# Patient Record
Sex: Male | Born: 1945 | Race: White | Hispanic: No | Marital: Married | Smoking: Never smoker
Health system: Southern US, Community
[De-identification: ages and names within clinical notes are randomized; demographics above are authoritative.]

## PROBLEM LIST (undated history)

## (undated) DIAGNOSIS — G4730 Sleep apnea, unspecified: Secondary | ICD-10-CM

## (undated) DIAGNOSIS — I10 Essential (primary) hypertension: Secondary | ICD-10-CM

## (undated) DIAGNOSIS — E785 Hyperlipidemia, unspecified: Secondary | ICD-10-CM

## (undated) DIAGNOSIS — D472 Monoclonal gammopathy: Secondary | ICD-10-CM

## (undated) DIAGNOSIS — Z8619 Personal history of other infectious and parasitic diseases: Secondary | ICD-10-CM

## (undated) DIAGNOSIS — R768 Other specified abnormal immunological findings in serum: Secondary | ICD-10-CM

## (undated) DIAGNOSIS — Z972 Presence of dental prosthetic device (complete) (partial): Secondary | ICD-10-CM

## (undated) HISTORY — PX: OTHER SURGICAL HISTORY: SHX169

## (undated) HISTORY — DX: Monoclonal gammopathy: D47.2

## (undated) HISTORY — DX: Other specified abnormal immunological findings in serum: R76.8

---

## 2011-02-25 ENCOUNTER — Emergency Department: Payer: Self-pay | Admitting: Emergency Medicine

## 2013-12-07 ENCOUNTER — Ambulatory Visit: Payer: Self-pay

## 2014-02-17 DIAGNOSIS — I10 Essential (primary) hypertension: Secondary | ICD-10-CM | POA: Insufficient documentation

## 2014-02-17 DIAGNOSIS — E785 Hyperlipidemia, unspecified: Secondary | ICD-10-CM | POA: Insufficient documentation

## 2014-02-17 DIAGNOSIS — E559 Vitamin D deficiency, unspecified: Secondary | ICD-10-CM | POA: Insufficient documentation

## 2015-03-19 DIAGNOSIS — B028 Zoster with other complications: Secondary | ICD-10-CM | POA: Diagnosis not present

## 2015-03-21 DIAGNOSIS — B0221 Postherpetic geniculate ganglionitis: Secondary | ICD-10-CM | POA: Diagnosis not present

## 2015-03-29 DIAGNOSIS — Z125 Encounter for screening for malignant neoplasm of prostate: Secondary | ICD-10-CM | POA: Diagnosis not present

## 2015-03-29 DIAGNOSIS — E785 Hyperlipidemia, unspecified: Secondary | ICD-10-CM | POA: Diagnosis not present

## 2015-03-29 DIAGNOSIS — E559 Vitamin D deficiency, unspecified: Secondary | ICD-10-CM | POA: Diagnosis not present

## 2015-03-29 DIAGNOSIS — I159 Secondary hypertension, unspecified: Secondary | ICD-10-CM | POA: Diagnosis not present

## 2015-03-30 DIAGNOSIS — B0221 Postherpetic geniculate ganglionitis: Secondary | ICD-10-CM | POA: Diagnosis not present

## 2015-04-05 DIAGNOSIS — N183 Chronic kidney disease, stage 3 unspecified: Secondary | ICD-10-CM | POA: Insufficient documentation

## 2015-04-05 DIAGNOSIS — I10 Essential (primary) hypertension: Secondary | ICD-10-CM | POA: Diagnosis not present

## 2015-04-05 DIAGNOSIS — Z0000 Encounter for general adult medical examination without abnormal findings: Secondary | ICD-10-CM | POA: Diagnosis not present

## 2015-04-05 DIAGNOSIS — D649 Anemia, unspecified: Secondary | ICD-10-CM | POA: Diagnosis not present

## 2015-04-11 DIAGNOSIS — B0221 Postherpetic geniculate ganglionitis: Secondary | ICD-10-CM | POA: Diagnosis not present

## 2015-05-23 DIAGNOSIS — D649 Anemia, unspecified: Secondary | ICD-10-CM | POA: Diagnosis not present

## 2015-05-23 DIAGNOSIS — Z1211 Encounter for screening for malignant neoplasm of colon: Secondary | ICD-10-CM | POA: Diagnosis not present

## 2015-05-31 DIAGNOSIS — H4011X3 Primary open-angle glaucoma, severe stage: Secondary | ICD-10-CM | POA: Diagnosis not present

## 2015-07-08 DIAGNOSIS — E780 Pure hypercholesterolemia: Secondary | ICD-10-CM | POA: Diagnosis not present

## 2015-07-08 DIAGNOSIS — N183 Chronic kidney disease, stage 3 (moderate): Secondary | ICD-10-CM | POA: Diagnosis not present

## 2015-07-08 DIAGNOSIS — I10 Essential (primary) hypertension: Secondary | ICD-10-CM | POA: Diagnosis not present

## 2015-07-08 DIAGNOSIS — D649 Anemia, unspecified: Secondary | ICD-10-CM | POA: Diagnosis not present

## 2015-07-21 ENCOUNTER — Encounter: Payer: Self-pay | Admitting: *Deleted

## 2015-07-22 ENCOUNTER — Encounter
Admission: RE | Disposition: A | Discharge status: Home / Self Care | Payer: Self-pay | Source: Ambulatory Visit | Attending: Gastroenterology

## 2015-07-22 ENCOUNTER — Ambulatory Visit
Admission: RE | Admit: 2015-07-22 | Discharge: 2015-07-22 | Disposition: A | Discharge status: Home / Self Care | Payer: Commercial Managed Care - HMO | Source: Ambulatory Visit | Attending: Gastroenterology | Admitting: Gastroenterology

## 2015-07-22 ENCOUNTER — Ambulatory Visit: Payer: Commercial Managed Care - HMO | Admitting: Anesthesiology

## 2015-07-22 ENCOUNTER — Encounter: Payer: Self-pay | Admitting: *Deleted

## 2015-07-22 DIAGNOSIS — I10 Essential (primary) hypertension: Secondary | ICD-10-CM | POA: Diagnosis not present

## 2015-07-22 DIAGNOSIS — E785 Hyperlipidemia, unspecified: Secondary | ICD-10-CM | POA: Diagnosis not present

## 2015-07-22 DIAGNOSIS — K5790 Diverticulosis of intestine, part unspecified, without perforation or abscess without bleeding: Secondary | ICD-10-CM | POA: Diagnosis not present

## 2015-07-22 DIAGNOSIS — K5730 Diverticulosis of large intestine without perforation or abscess without bleeding: Secondary | ICD-10-CM | POA: Diagnosis not present

## 2015-07-22 DIAGNOSIS — Z1211 Encounter for screening for malignant neoplasm of colon: Secondary | ICD-10-CM | POA: Insufficient documentation

## 2015-07-22 HISTORY — DX: Essential (primary) hypertension: I10

## 2015-07-22 HISTORY — DX: Hyperlipidemia, unspecified: E78.5

## 2015-07-22 HISTORY — DX: Personal history of other infectious and parasitic diseases: Z86.19

## 2015-07-22 HISTORY — PX: COLONOSCOPY WITH PROPOFOL: SHX5780

## 2015-07-22 SURGERY — COLONOSCOPY WITH PROPOFOL
Anesthesia: General

## 2015-07-22 MED ORDER — PROPOFOL 10 MG/ML IV BOLUS
INTRAVENOUS | Status: DC | PRN
  Administered 2015-07-22: 50 mg via INTRAVENOUS

## 2015-07-22 MED ORDER — SODIUM CHLORIDE 0.9 % IV SOLN
INTRAVENOUS | Status: DC
Start: 2015-07-22 — End: 2015-07-22

## 2015-07-22 MED ORDER — PROPOFOL 500 MG/50ML IV EMUL
INTRAVENOUS | Status: DC | PRN
  Administered 2015-07-22: 120 ug/kg/min via INTRAVENOUS

## 2015-07-22 MED ORDER — FENTANYL CITRATE (PF) 100 MCG/2ML IJ SOLN
INTRAMUSCULAR | Status: DC | PRN
  Administered 2015-07-22: 1 ug via INTRAVENOUS

## 2015-07-22 MED ORDER — MIDAZOLAM HCL 5 MG/5ML IJ SOLN
INTRAMUSCULAR | Status: DC | PRN
  Administered 2015-07-22: 1 mg via INTRAVENOUS

## 2015-07-22 MED ORDER — SODIUM CHLORIDE 0.9 % IV SOLN
INTRAVENOUS | Status: DC
  Administered 2015-07-22 (×2): via INTRAVENOUS

## 2015-07-22 MED ORDER — EPHEDRINE SULFATE 50 MG/ML IJ SOLN
INTRAMUSCULAR | Status: DC | PRN
  Administered 2015-07-22: 10 mg via INTRAVENOUS

## 2015-07-22 NOTE — Anesthesia Postprocedure Evaluation (Signed)
  Anesthesia Post-op Note  Patient: Bruce Garza  Procedure(s) Performed: Procedure(s): COLONOSCOPY WITH PROPOFOL (N/A)  Anesthesia type:General  Patient location: PACU  Post pain: Pain level controlled  Post assessment: Post-op Vital signs reviewed, Patient's Cardiovascular Status Stable, Respiratory Function Stable, Patent Airway and No signs of Nausea or vomiting  Post vital signs: Reviewed and stable  Last Vitals:  Filed Vitals:   07/22/15 0850  BP: 121/70  Pulse: 56  Temp:   Resp: 16    Level of consciousness: awake, alert  and patient cooperative  Complications: No apparent anesthesia complications

## 2015-07-22 NOTE — H&P (Signed)
Outpatient short stay form Pre-procedure 07/22/2015 8:04 AM Bruce Sails MD  Primary Physician: Glendon Axe  Reason for visit:  Screening colonoscopy  History of present illness:  Patient is a 69 year old male presenting today for his first colonoscopy. He tolerated his prep well. He takes no aspirin or anticoagulation medications. Is no family history of colon cancer colon polyps.    Current facility-administered medications:  .  0.9 %  sodium chloride infusion, , Intravenous, Continuous, Bruce Sails, MD, Last Rate: 20 mL/hr at 07/22/15 0750 .  0.9 %  sodium chloride infusion, , Intravenous, Continuous, Bruce Sails, MD  Prescriptions prior to admission  Medication Sig Dispense Refill Last Dose  . ascorbic acid (VITAMIN C) 1000 MG tablet Take 1,000 mg by mouth daily.   Past Week at Unknown time  . dorzolamide-timolol (COSOPT) 22.3-6.8 MG/ML ophthalmic solution 1 drop 2 (two) times daily.   07/21/2015 at Unknown time  . lisinopril-hydrochlorothiazide (PRINZIDE,ZESTORETIC) 20-12.5 MG tablet Take 1 tablet by mouth daily.   07/21/2015 at Unknown time  . pravastatin (PRAVACHOL) 10 MG tablet Take 10 mg by mouth daily.   07/21/2015 at Unknown time  . prednisoLONE acetate (PRED FORTE) 1 % ophthalmic suspension 1 drop 4 (four) times daily.   07/21/2015 at Unknown time     No Known Allergies   Past Medical History  Diagnosis Date  . Hypertension   . Hyperlipidemia   . History of shingles     Review of systems:      Physical Exam    Heart and lungs: Regular rate and rhythm without rub or gallop, clear to auscultation    HEENT: Normocephalic atraumatic eyes are anicteric    Other:     Pertinant exam for procedure: Soft nontender nondistended bowel sounds positive and normoactive    Planned proceedures: Colonoscopy and indicated procedures I have discussed the risks benefits and complications of procedures to include not limited to bleeding, infection, perforation  and the risk of sedation and the patient wishes to proceed.    Bruce Sails, MD Gastroenterology 07/22/2015  8:04 AM

## 2015-07-22 NOTE — Transfer of Care (Signed)
Immediate Anesthesia Transfer of Care Note  Patient: Bruce Garza  Procedure(s) Performed: Procedure(s): COLONOSCOPY WITH PROPOFOL (N/A)  Patient Location: PACU  Anesthesia Type:General  Level of Consciousness: awake and sedated  Airway & Oxygen Therapy: Patient Spontanous Breathing and Patient connected to nasal cannula oxygen  Post-op Assessment: Report given to RN and Post -op Vital signs reviewed and stable  Post vital signs: Reviewed and stable  Last Vitals:  Filed Vitals:   07/22/15 0830  BP:   Pulse: 59  Temp: 36.4 C  Resp: 13    Complications: No apparent anesthesia complications

## 2015-07-22 NOTE — Anesthesia Procedure Notes (Signed)
Date/Time: 07/22/2015 8:18 AM Performed by: Allean Found Pre-anesthesia Checklist: Patient identified, Emergency Drugs available, Suction available, Patient being monitored and Timeout performed Patient Re-evaluated:Patient Re-evaluated prior to inductionOxygen Delivery Method: Nasal cannula Intubation Type: IV induction

## 2015-07-22 NOTE — Op Note (Signed)
[facility] Gastroenterology Patient Name: Bruce Garza Procedure Date: 07/22/2015 8:06 AM MRN: [id number] Account #: [id number] Date of Birth: 03-01-1946 Admit Type: Outpatient Age: 69 Room: [facility] ENDO ROOM 3 Gender: Male Note Status: Finalized Procedure:         Colonoscopy Indications:       Screening for colorectal malignant neoplasm, This is the                     patient's first colonoscopy Providers:         Lollie Sails, MD Referring MD:      Glendon Axe (Referring MD) Medicines:         Monitored Anesthesia Care Complications:     No immediate complications. Procedure:         Pre-Anesthesia Assessment:                    - ASA Grade Assessment: III - A patient with severe                     systemic disease.                    After obtaining informed consent, the colonoscope was                     passed under direct vision. Throughout the procedure, the                     patient's blood pressure, pulse, and oxygen saturations                     were monitored continuously. The Colonoscope was                     introduced through the anus and advanced to the the cecum,                     identified by appendiceal orifice and ileocecal valve. Findings:      Multiple medium-mouthed diverticula were found in the sigmoid colon and       in the distal descending colon.      The retroflexed view of the distal rectum and anal verge was normal and       showed no anal or rectal abnormalities.      The digital rectal exam was normal. Impression:        - Diverticulosis in the sigmoid colon and in the distal                     descending colon.                    - The distal rectum and anal verge are normal on                     retroflexion view.                    - No specimens collected. Recommendation:    - Repeat colonoscopy in 10 years for screening purposes. Procedure Code(s): --- Professional ---                    [phone],  Colonoscopy, flexible; diagnostic, including  collection of specimen(s) by brushing or washing, when                     performed (separate procedure) Diagnosis Code(s): --- Professional ---                    V76.51, Special screening for malignant neoplasms of colon                    562.10, Diverticulosis of colon (without mention of                     hemorrhage) CPT copyright 2014 American Medical Association. All rights reserved. The codes documented in this report are preliminary and upon coder review may  be revised to meet current compliance requirements. Lollie Sails, MD 07/22/2015 8:25:31 AM This report has been signed electronically. Number of Addenda: 0 Note Initiated On: 07/22/2015 8:06 AM Scope Withdrawal Time: 0 hours 5 minutes 28 seconds  Total Procedure Duration: 0 hours 10 minutes 45 seconds       [facility]

## 2015-07-22 NOTE — Anesthesia Preprocedure Evaluation (Signed)
Anesthesia Evaluation  Patient identified by MRN, date of birth, ID band Patient awake    Reviewed: Allergy & Precautions, H&P , NPO status , Patient's Chart, lab work & pertinent test results  Airway Mallampati: III  TM Distance: >3 FB Neck ROM: limited    Dental  (+) Poor Dentition, Missing, Upper Dentures, Lower Dentures   Pulmonary neg pulmonary ROS, neg shortness of breath,    Pulmonary exam normal breath sounds clear to auscultation       Cardiovascular Exercise Tolerance: Good hypertension, (-) Past MI Normal cardiovascular exam Rhythm:regular Rate:Normal     Neuro/Psych negative neurological ROS  negative psych ROS   GI/Hepatic negative GI ROS, Neg liver ROS, neg GERD  ,  Endo/Other  negative endocrine ROS  Renal/GU negative Renal ROS  negative genitourinary   Musculoskeletal   Abdominal   Peds  Hematology negative hematology ROS (+)   Anesthesia Other Findings Past Medical History:   Hypertension                                                 Hyperlipidemia                                               History of shingles                                         Signs and symptoms suggestive of sleep apnea   Reproductive/Obstetrics negative OB ROS                             Anesthesia Physical Anesthesia Plan  ASA: III  Anesthesia Plan: General   Post-op Pain Management:    Induction:   Airway Management Planned:   Additional Equipment:   Intra-op Plan:   Post-operative Plan:   Informed Consent: I have reviewed the patients History and Physical, chart, labs and discussed the procedure including the risks, benefits and alternatives for the proposed anesthesia with the patient or authorized representative who has indicated his/her understanding and acceptance.   Dental Advisory Given  Plan Discussed with: Anesthesiologist, CRNA and Surgeon  Anesthesia Plan  Comments:         Anesthesia Quick Evaluation

## 2015-07-25 ENCOUNTER — Encounter: Payer: Self-pay | Admitting: Gastroenterology

## 2015-09-06 DIAGNOSIS — H401133 Primary open-angle glaucoma, bilateral, severe stage: Secondary | ICD-10-CM | POA: Diagnosis not present

## 2015-09-19 DIAGNOSIS — R430 Anosmia: Secondary | ICD-10-CM | POA: Diagnosis not present

## 2015-09-19 DIAGNOSIS — R432 Parageusia: Secondary | ICD-10-CM | POA: Diagnosis not present

## 2015-10-27 DIAGNOSIS — D649 Anemia, unspecified: Secondary | ICD-10-CM | POA: Diagnosis not present

## 2015-10-27 DIAGNOSIS — N183 Chronic kidney disease, stage 3 (moderate): Secondary | ICD-10-CM | POA: Diagnosis not present

## 2015-11-03 ENCOUNTER — Other Ambulatory Visit: Payer: Self-pay | Admitting: Internal Medicine

## 2015-11-03 DIAGNOSIS — N183 Chronic kidney disease, stage 3 unspecified: Secondary | ICD-10-CM

## 2015-11-03 DIAGNOSIS — B0222 Postherpetic trigeminal neuralgia: Secondary | ICD-10-CM | POA: Insufficient documentation

## 2015-11-03 DIAGNOSIS — E7800 Pure hypercholesterolemia, unspecified: Secondary | ICD-10-CM | POA: Diagnosis not present

## 2015-11-03 DIAGNOSIS — I10 Essential (primary) hypertension: Secondary | ICD-10-CM | POA: Diagnosis not present

## 2015-11-03 DIAGNOSIS — E559 Vitamin D deficiency, unspecified: Secondary | ICD-10-CM | POA: Diagnosis not present

## 2015-11-10 ENCOUNTER — Ambulatory Visit: Payer: Commercial Managed Care - HMO

## 2015-11-11 ENCOUNTER — Ambulatory Visit
Admission: RE | Admit: 2015-11-11 | Discharge: 2015-11-11 | Disposition: A | Discharge status: Home / Self Care | Payer: Commercial Managed Care - HMO | Source: Ambulatory Visit | Attending: Internal Medicine | Admitting: Internal Medicine

## 2015-11-11 DIAGNOSIS — N183 Chronic kidney disease, stage 3 unspecified: Secondary | ICD-10-CM

## 2015-11-11 DIAGNOSIS — N189 Chronic kidney disease, unspecified: Secondary | ICD-10-CM | POA: Diagnosis not present

## 2015-11-11 DIAGNOSIS — R938 Abnormal findings on diagnostic imaging of other specified body structures: Secondary | ICD-10-CM | POA: Insufficient documentation

## 2015-12-07 DIAGNOSIS — H401133 Primary open-angle glaucoma, bilateral, severe stage: Secondary | ICD-10-CM | POA: Diagnosis not present

## 2016-01-23 DIAGNOSIS — E559 Vitamin D deficiency, unspecified: Secondary | ICD-10-CM | POA: Diagnosis not present

## 2016-01-23 DIAGNOSIS — N183 Chronic kidney disease, stage 3 (moderate): Secondary | ICD-10-CM | POA: Diagnosis not present

## 2016-01-30 DIAGNOSIS — Z125 Encounter for screening for malignant neoplasm of prostate: Secondary | ICD-10-CM | POA: Diagnosis not present

## 2016-01-30 DIAGNOSIS — E7800 Pure hypercholesterolemia, unspecified: Secondary | ICD-10-CM | POA: Diagnosis not present

## 2016-01-30 DIAGNOSIS — I10 Essential (primary) hypertension: Secondary | ICD-10-CM | POA: Diagnosis not present

## 2016-01-30 DIAGNOSIS — J301 Allergic rhinitis due to pollen: Secondary | ICD-10-CM | POA: Insufficient documentation

## 2016-01-30 DIAGNOSIS — N183 Chronic kidney disease, stage 3 (moderate): Secondary | ICD-10-CM | POA: Diagnosis not present

## 2016-03-06 DIAGNOSIS — H401133 Primary open-angle glaucoma, bilateral, severe stage: Secondary | ICD-10-CM | POA: Diagnosis not present

## 2016-03-14 DIAGNOSIS — H401133 Primary open-angle glaucoma, bilateral, severe stage: Secondary | ICD-10-CM | POA: Diagnosis not present

## 2016-07-18 DIAGNOSIS — H401133 Primary open-angle glaucoma, bilateral, severe stage: Secondary | ICD-10-CM | POA: Diagnosis not present

## 2016-12-05 DIAGNOSIS — H401133 Primary open-angle glaucoma, bilateral, severe stage: Secondary | ICD-10-CM | POA: Diagnosis not present

## 2016-12-21 DIAGNOSIS — H6502 Acute serous otitis media, left ear: Secondary | ICD-10-CM | POA: Diagnosis not present

## 2016-12-21 DIAGNOSIS — J019 Acute sinusitis, unspecified: Secondary | ICD-10-CM | POA: Diagnosis not present

## 2016-12-21 DIAGNOSIS — D2230 Melanocytic nevi of unspecified part of face: Secondary | ICD-10-CM | POA: Diagnosis not present

## 2016-12-21 DIAGNOSIS — J309 Allergic rhinitis, unspecified: Secondary | ICD-10-CM | POA: Diagnosis not present

## 2016-12-26 DIAGNOSIS — E7800 Pure hypercholesterolemia, unspecified: Secondary | ICD-10-CM | POA: Diagnosis not present

## 2016-12-26 DIAGNOSIS — I10 Essential (primary) hypertension: Secondary | ICD-10-CM | POA: Diagnosis not present

## 2016-12-26 DIAGNOSIS — N183 Chronic kidney disease, stage 3 (moderate): Secondary | ICD-10-CM | POA: Diagnosis not present

## 2016-12-26 DIAGNOSIS — R55 Syncope and collapse: Secondary | ICD-10-CM | POA: Diagnosis not present

## 2017-01-25 DIAGNOSIS — I6523 Occlusion and stenosis of bilateral carotid arteries: Secondary | ICD-10-CM | POA: Diagnosis not present

## 2017-01-25 DIAGNOSIS — R55 Syncope and collapse: Secondary | ICD-10-CM | POA: Diagnosis not present

## 2017-02-01 DIAGNOSIS — I10 Essential (primary) hypertension: Secondary | ICD-10-CM | POA: Diagnosis not present

## 2017-02-01 DIAGNOSIS — Z131 Encounter for screening for diabetes mellitus: Secondary | ICD-10-CM | POA: Diagnosis not present

## 2017-02-08 DIAGNOSIS — R7303 Prediabetes: Secondary | ICD-10-CM | POA: Insufficient documentation

## 2017-02-08 DIAGNOSIS — L989 Disorder of the skin and subcutaneous tissue, unspecified: Secondary | ICD-10-CM | POA: Diagnosis not present

## 2017-02-08 DIAGNOSIS — E7800 Pure hypercholesterolemia, unspecified: Secondary | ICD-10-CM | POA: Insufficient documentation

## 2017-02-08 DIAGNOSIS — Z23 Encounter for immunization: Secondary | ICD-10-CM | POA: Diagnosis not present

## 2017-02-08 DIAGNOSIS — Z0000 Encounter for general adult medical examination without abnormal findings: Secondary | ICD-10-CM | POA: Diagnosis not present

## 2017-02-08 DIAGNOSIS — Z125 Encounter for screening for malignant neoplasm of prostate: Secondary | ICD-10-CM | POA: Diagnosis not present

## 2017-02-08 DIAGNOSIS — N183 Chronic kidney disease, stage 3 (moderate): Secondary | ICD-10-CM | POA: Diagnosis not present

## 2017-02-26 DIAGNOSIS — T783XXA Angioneurotic edema, initial encounter: Secondary | ICD-10-CM | POA: Diagnosis not present

## 2017-03-01 DIAGNOSIS — E782 Mixed hyperlipidemia: Secondary | ICD-10-CM | POA: Diagnosis not present

## 2017-03-01 DIAGNOSIS — T783XXD Angioneurotic edema, subsequent encounter: Secondary | ICD-10-CM | POA: Diagnosis not present

## 2017-03-01 DIAGNOSIS — I10 Essential (primary) hypertension: Secondary | ICD-10-CM | POA: Diagnosis not present

## 2017-03-05 DIAGNOSIS — H401133 Primary open-angle glaucoma, bilateral, severe stage: Secondary | ICD-10-CM | POA: Diagnosis not present

## 2017-03-06 DIAGNOSIS — H401133 Primary open-angle glaucoma, bilateral, severe stage: Secondary | ICD-10-CM | POA: Diagnosis not present

## 2017-04-11 DIAGNOSIS — D229 Melanocytic nevi, unspecified: Secondary | ICD-10-CM | POA: Diagnosis not present

## 2017-04-11 DIAGNOSIS — L739 Follicular disorder, unspecified: Secondary | ICD-10-CM | POA: Diagnosis not present

## 2017-04-11 DIAGNOSIS — B078 Other viral warts: Secondary | ICD-10-CM | POA: Diagnosis not present

## 2017-04-11 DIAGNOSIS — L820 Inflamed seborrheic keratosis: Secondary | ICD-10-CM | POA: Diagnosis not present

## 2017-05-10 DIAGNOSIS — E7800 Pure hypercholesterolemia, unspecified: Secondary | ICD-10-CM | POA: Diagnosis not present

## 2017-05-10 DIAGNOSIS — Z125 Encounter for screening for malignant neoplasm of prostate: Secondary | ICD-10-CM | POA: Diagnosis not present

## 2017-05-17 DIAGNOSIS — E7800 Pure hypercholesterolemia, unspecified: Secondary | ICD-10-CM | POA: Diagnosis not present

## 2017-05-17 DIAGNOSIS — I10 Essential (primary) hypertension: Secondary | ICD-10-CM | POA: Diagnosis not present

## 2017-05-17 DIAGNOSIS — N183 Chronic kidney disease, stage 3 (moderate): Secondary | ICD-10-CM | POA: Diagnosis not present

## 2017-05-17 DIAGNOSIS — R7303 Prediabetes: Secondary | ICD-10-CM | POA: Diagnosis not present

## 2017-05-17 DIAGNOSIS — Z23 Encounter for immunization: Secondary | ICD-10-CM | POA: Diagnosis not present

## 2017-05-17 DIAGNOSIS — Z0000 Encounter for general adult medical examination without abnormal findings: Secondary | ICD-10-CM | POA: Diagnosis not present

## 2017-07-04 DIAGNOSIS — H401133 Primary open-angle glaucoma, bilateral, severe stage: Secondary | ICD-10-CM | POA: Diagnosis not present

## 2017-11-01 DIAGNOSIS — H353132 Nonexudative age-related macular degeneration, bilateral, intermediate dry stage: Secondary | ICD-10-CM | POA: Diagnosis not present

## 2017-11-11 DIAGNOSIS — R7303 Prediabetes: Secondary | ICD-10-CM | POA: Diagnosis not present

## 2017-11-11 DIAGNOSIS — E7800 Pure hypercholesterolemia, unspecified: Secondary | ICD-10-CM | POA: Diagnosis not present

## 2017-11-11 DIAGNOSIS — I10 Essential (primary) hypertension: Secondary | ICD-10-CM | POA: Diagnosis not present

## 2017-11-18 DIAGNOSIS — I10 Essential (primary) hypertension: Secondary | ICD-10-CM | POA: Diagnosis not present

## 2017-11-18 DIAGNOSIS — R7303 Prediabetes: Secondary | ICD-10-CM | POA: Diagnosis not present

## 2017-11-18 DIAGNOSIS — E7800 Pure hypercholesterolemia, unspecified: Secondary | ICD-10-CM | POA: Diagnosis not present

## 2017-11-18 DIAGNOSIS — N183 Chronic kidney disease, stage 3 (moderate): Secondary | ICD-10-CM | POA: Diagnosis not present

## 2017-12-23 DIAGNOSIS — I10 Essential (primary) hypertension: Secondary | ICD-10-CM | POA: Diagnosis not present

## 2017-12-30 DIAGNOSIS — I10 Essential (primary) hypertension: Secondary | ICD-10-CM | POA: Diagnosis not present

## 2018-02-10 DIAGNOSIS — N183 Chronic kidney disease, stage 3 (moderate): Secondary | ICD-10-CM | POA: Diagnosis not present

## 2018-02-10 DIAGNOSIS — I10 Essential (primary) hypertension: Secondary | ICD-10-CM | POA: Diagnosis not present

## 2018-02-17 DIAGNOSIS — I10 Essential (primary) hypertension: Secondary | ICD-10-CM | POA: Diagnosis not present

## 2018-02-17 DIAGNOSIS — N183 Chronic kidney disease, stage 3 (moderate): Secondary | ICD-10-CM | POA: Diagnosis not present

## 2018-02-17 DIAGNOSIS — R0683 Snoring: Secondary | ICD-10-CM | POA: Diagnosis not present

## 2018-02-17 DIAGNOSIS — E876 Hypokalemia: Secondary | ICD-10-CM | POA: Insufficient documentation

## 2018-02-26 DIAGNOSIS — J301 Allergic rhinitis due to pollen: Secondary | ICD-10-CM | POA: Diagnosis not present

## 2018-02-26 DIAGNOSIS — H6123 Impacted cerumen, bilateral: Secondary | ICD-10-CM | POA: Diagnosis not present

## 2018-03-07 DIAGNOSIS — H353132 Nonexudative age-related macular degeneration, bilateral, intermediate dry stage: Secondary | ICD-10-CM | POA: Diagnosis not present

## 2018-03-13 DIAGNOSIS — E876 Hypokalemia: Secondary | ICD-10-CM | POA: Diagnosis not present

## 2018-03-14 DIAGNOSIS — H353132 Nonexudative age-related macular degeneration, bilateral, intermediate dry stage: Secondary | ICD-10-CM | POA: Diagnosis not present

## 2018-03-17 DIAGNOSIS — R0683 Snoring: Secondary | ICD-10-CM | POA: Diagnosis not present

## 2018-03-17 DIAGNOSIS — G4710 Hypersomnia, unspecified: Secondary | ICD-10-CM | POA: Diagnosis not present

## 2018-03-19 DIAGNOSIS — G4733 Obstructive sleep apnea (adult) (pediatric): Secondary | ICD-10-CM | POA: Diagnosis not present

## 2018-03-21 DIAGNOSIS — G4733 Obstructive sleep apnea (adult) (pediatric): Secondary | ICD-10-CM | POA: Insufficient documentation

## 2018-03-21 DIAGNOSIS — I10 Essential (primary) hypertension: Secondary | ICD-10-CM | POA: Diagnosis not present

## 2018-04-10 DIAGNOSIS — G4733 Obstructive sleep apnea (adult) (pediatric): Secondary | ICD-10-CM | POA: Diagnosis not present

## 2018-05-09 DIAGNOSIS — G4733 Obstructive sleep apnea (adult) (pediatric): Secondary | ICD-10-CM | POA: Diagnosis not present

## 2018-05-10 DIAGNOSIS — G4733 Obstructive sleep apnea (adult) (pediatric): Secondary | ICD-10-CM | POA: Diagnosis not present

## 2018-06-06 DIAGNOSIS — G4733 Obstructive sleep apnea (adult) (pediatric): Secondary | ICD-10-CM | POA: Diagnosis not present

## 2018-06-10 DIAGNOSIS — G4733 Obstructive sleep apnea (adult) (pediatric): Secondary | ICD-10-CM | POA: Diagnosis not present

## 2018-06-25 DIAGNOSIS — I10 Essential (primary) hypertension: Secondary | ICD-10-CM | POA: Diagnosis not present

## 2018-06-25 DIAGNOSIS — E876 Hypokalemia: Secondary | ICD-10-CM | POA: Diagnosis not present

## 2018-07-11 DIAGNOSIS — G4733 Obstructive sleep apnea (adult) (pediatric): Secondary | ICD-10-CM | POA: Diagnosis not present

## 2018-07-14 DIAGNOSIS — H353132 Nonexudative age-related macular degeneration, bilateral, intermediate dry stage: Secondary | ICD-10-CM | POA: Diagnosis not present

## 2018-07-24 DIAGNOSIS — E876 Hypokalemia: Secondary | ICD-10-CM | POA: Diagnosis not present

## 2018-07-24 DIAGNOSIS — I10 Essential (primary) hypertension: Secondary | ICD-10-CM | POA: Diagnosis not present

## 2018-07-24 DIAGNOSIS — Z23 Encounter for immunization: Secondary | ICD-10-CM | POA: Diagnosis not present

## 2018-07-24 DIAGNOSIS — R7303 Prediabetes: Secondary | ICD-10-CM | POA: Diagnosis not present

## 2018-07-24 DIAGNOSIS — N183 Chronic kidney disease, stage 3 (moderate): Secondary | ICD-10-CM | POA: Diagnosis not present

## 2018-07-24 DIAGNOSIS — R05 Cough: Secondary | ICD-10-CM | POA: Diagnosis not present

## 2018-07-24 DIAGNOSIS — Z0000 Encounter for general adult medical examination without abnormal findings: Secondary | ICD-10-CM | POA: Diagnosis not present

## 2018-08-10 DIAGNOSIS — G4733 Obstructive sleep apnea (adult) (pediatric): Secondary | ICD-10-CM | POA: Diagnosis not present

## 2018-08-27 DIAGNOSIS — R7303 Prediabetes: Secondary | ICD-10-CM | POA: Diagnosis not present

## 2018-08-27 DIAGNOSIS — I10 Essential (primary) hypertension: Secondary | ICD-10-CM | POA: Diagnosis not present

## 2018-09-03 DIAGNOSIS — I10 Essential (primary) hypertension: Secondary | ICD-10-CM | POA: Diagnosis not present

## 2018-09-03 DIAGNOSIS — R7303 Prediabetes: Secondary | ICD-10-CM | POA: Diagnosis not present

## 2018-09-03 DIAGNOSIS — N183 Chronic kidney disease, stage 3 (moderate): Secondary | ICD-10-CM | POA: Diagnosis not present

## 2018-09-03 DIAGNOSIS — E876 Hypokalemia: Secondary | ICD-10-CM | POA: Diagnosis not present

## 2018-09-10 DIAGNOSIS — G4733 Obstructive sleep apnea (adult) (pediatric): Secondary | ICD-10-CM | POA: Diagnosis not present

## 2018-10-07 DIAGNOSIS — G4733 Obstructive sleep apnea (adult) (pediatric): Secondary | ICD-10-CM | POA: Diagnosis not present

## 2018-10-10 DIAGNOSIS — G4733 Obstructive sleep apnea (adult) (pediatric): Secondary | ICD-10-CM | POA: Diagnosis not present

## 2018-11-10 DIAGNOSIS — G4733 Obstructive sleep apnea (adult) (pediatric): Secondary | ICD-10-CM | POA: Diagnosis not present

## 2018-11-11 DIAGNOSIS — H401133 Primary open-angle glaucoma, bilateral, severe stage: Secondary | ICD-10-CM | POA: Diagnosis not present

## 2018-12-04 DIAGNOSIS — R7303 Prediabetes: Secondary | ICD-10-CM | POA: Diagnosis not present

## 2018-12-04 DIAGNOSIS — I10 Essential (primary) hypertension: Secondary | ICD-10-CM | POA: Diagnosis not present

## 2018-12-11 DIAGNOSIS — N183 Chronic kidney disease, stage 3 (moderate): Secondary | ICD-10-CM | POA: Diagnosis not present

## 2018-12-11 DIAGNOSIS — I10 Essential (primary) hypertension: Secondary | ICD-10-CM | POA: Diagnosis not present

## 2018-12-11 DIAGNOSIS — G4733 Obstructive sleep apnea (adult) (pediatric): Secondary | ICD-10-CM | POA: Diagnosis not present

## 2018-12-16 DIAGNOSIS — G4733 Obstructive sleep apnea (adult) (pediatric): Secondary | ICD-10-CM | POA: Diagnosis not present

## 2018-12-16 DIAGNOSIS — R05 Cough: Secondary | ICD-10-CM | POA: Diagnosis not present

## 2019-01-09 DIAGNOSIS — G4733 Obstructive sleep apnea (adult) (pediatric): Secondary | ICD-10-CM | POA: Diagnosis not present

## 2019-02-09 ENCOUNTER — Other Ambulatory Visit: Payer: Self-pay | Admitting: Internal Medicine

## 2019-02-09 ENCOUNTER — Other Ambulatory Visit (HOSPITAL_COMMUNITY): Payer: Self-pay | Admitting: Internal Medicine

## 2019-02-09 DIAGNOSIS — I10 Essential (primary) hypertension: Secondary | ICD-10-CM | POA: Diagnosis not present

## 2019-02-09 DIAGNOSIS — N183 Chronic kidney disease, stage 3 unspecified: Secondary | ICD-10-CM

## 2019-02-09 DIAGNOSIS — G4733 Obstructive sleep apnea (adult) (pediatric): Secondary | ICD-10-CM | POA: Diagnosis not present

## 2019-02-16 DIAGNOSIS — G4733 Obstructive sleep apnea (adult) (pediatric): Secondary | ICD-10-CM | POA: Diagnosis not present

## 2019-03-10 DIAGNOSIS — N183 Chronic kidney disease, stage 3 (moderate): Secondary | ICD-10-CM | POA: Diagnosis not present

## 2019-03-11 DIAGNOSIS — G4733 Obstructive sleep apnea (adult) (pediatric): Secondary | ICD-10-CM | POA: Diagnosis not present

## 2019-03-16 ENCOUNTER — Ambulatory Visit
Admission: RE | Admit: 2019-03-16 | Discharge: 2019-03-16 | Disposition: A | Discharge status: Home / Self Care | Payer: Medicare HMO | Source: Ambulatory Visit | Attending: Internal Medicine | Admitting: Internal Medicine

## 2019-03-16 ENCOUNTER — Ambulatory Visit: Admission: RE | Admit: 2019-03-16 | Payer: Medicare HMO | Source: Ambulatory Visit

## 2019-03-16 ENCOUNTER — Other Ambulatory Visit: Payer: Self-pay

## 2019-03-16 DIAGNOSIS — E876 Hypokalemia: Secondary | ICD-10-CM | POA: Diagnosis not present

## 2019-03-16 DIAGNOSIS — N189 Chronic kidney disease, unspecified: Secondary | ICD-10-CM | POA: Diagnosis not present

## 2019-03-16 DIAGNOSIS — G4733 Obstructive sleep apnea (adult) (pediatric): Secondary | ICD-10-CM | POA: Diagnosis not present

## 2019-03-16 DIAGNOSIS — Z0000 Encounter for general adult medical examination without abnormal findings: Secondary | ICD-10-CM | POA: Diagnosis not present

## 2019-03-16 DIAGNOSIS — N183 Chronic kidney disease, stage 3 unspecified: Secondary | ICD-10-CM

## 2019-03-16 DIAGNOSIS — N281 Cyst of kidney, acquired: Secondary | ICD-10-CM | POA: Diagnosis not present

## 2019-03-16 DIAGNOSIS — I10 Essential (primary) hypertension: Secondary | ICD-10-CM | POA: Diagnosis not present

## 2019-03-16 DIAGNOSIS — R7303 Prediabetes: Secondary | ICD-10-CM | POA: Diagnosis not present

## 2019-03-19 DIAGNOSIS — H401133 Primary open-angle glaucoma, bilateral, severe stage: Secondary | ICD-10-CM | POA: Diagnosis not present

## 2019-03-19 DIAGNOSIS — E1136 Type 2 diabetes mellitus with diabetic cataract: Secondary | ICD-10-CM | POA: Diagnosis not present

## 2019-03-19 DIAGNOSIS — H353132 Nonexudative age-related macular degeneration, bilateral, intermediate dry stage: Secondary | ICD-10-CM | POA: Diagnosis not present

## 2019-04-06 DIAGNOSIS — G4733 Obstructive sleep apnea (adult) (pediatric): Secondary | ICD-10-CM | POA: Diagnosis not present

## 2019-04-07 DIAGNOSIS — G4733 Obstructive sleep apnea (adult) (pediatric): Secondary | ICD-10-CM | POA: Diagnosis not present

## 2019-04-11 DIAGNOSIS — G4733 Obstructive sleep apnea (adult) (pediatric): Secondary | ICD-10-CM | POA: Diagnosis not present

## 2019-04-27 DIAGNOSIS — I10 Essential (primary) hypertension: Secondary | ICD-10-CM | POA: Diagnosis not present

## 2019-04-27 DIAGNOSIS — N183 Chronic kidney disease, stage 3 (moderate): Secondary | ICD-10-CM | POA: Diagnosis not present

## 2019-05-11 DIAGNOSIS — G4733 Obstructive sleep apnea (adult) (pediatric): Secondary | ICD-10-CM | POA: Diagnosis not present

## 2019-05-26 ENCOUNTER — Inpatient Hospital Stay: Payer: Medicare HMO

## 2019-05-26 ENCOUNTER — Encounter (INDEPENDENT_AMBULATORY_CARE_PROVIDER_SITE_OTHER): Payer: Self-pay

## 2019-05-26 ENCOUNTER — Inpatient Hospital Stay: Payer: Medicare HMO | Attending: Oncology | Admitting: Oncology

## 2019-05-26 ENCOUNTER — Other Ambulatory Visit: Payer: Self-pay

## 2019-05-26 ENCOUNTER — Encounter: Payer: Self-pay | Admitting: Oncology

## 2019-05-26 ENCOUNTER — Inpatient Hospital Stay: Payer: Medicare HMO | Admitting: Oncology

## 2019-05-26 VITALS — BP 139/79 | HR 62 | Temp 96.4°F | Resp 16 | Wt 183.9 lb

## 2019-05-26 DIAGNOSIS — E785 Hyperlipidemia, unspecified: Secondary | ICD-10-CM | POA: Insufficient documentation

## 2019-05-26 DIAGNOSIS — Z79899 Other long term (current) drug therapy: Secondary | ICD-10-CM | POA: Insufficient documentation

## 2019-05-26 DIAGNOSIS — I10 Essential (primary) hypertension: Secondary | ICD-10-CM | POA: Insufficient documentation

## 2019-05-26 DIAGNOSIS — R768 Other specified abnormal immunological findings in serum: Secondary | ICD-10-CM

## 2019-05-26 DIAGNOSIS — E8581 Light chain (AL) amyloidosis: Secondary | ICD-10-CM | POA: Insufficient documentation

## 2019-05-26 LAB — COMPREHENSIVE METABOLIC PANEL
ALT: 23 U/L (ref 0–44)
AST: 23 U/L (ref 15–41)
Albumin: 4.5 g/dL (ref 3.5–5.0)
Alkaline Phosphatase: 74 U/L (ref 38–126)
Anion gap: 8 (ref 5–15)
BUN: 24 mg/dL — ABNORMAL HIGH (ref 8–23)
CO2: 25 mmol/L (ref 22–32)
Calcium: 9.3 mg/dL (ref 8.9–10.3)
Chloride: 105 mmol/L (ref 98–111)
Creatinine, Ser: 1.51 mg/dL — ABNORMAL HIGH (ref 0.61–1.24)
GFR calc Af Amer: 52 mL/min — ABNORMAL LOW (ref >60)
GFR calc non Af Amer: 45 mL/min — ABNORMAL LOW (ref >60)
Glucose, Bld: 141 mg/dL — ABNORMAL HIGH (ref 70–99)
Potassium: 4.3 mmol/L (ref 3.5–5.1)
Sodium: 138 mmol/L (ref 135–145)
Total Bilirubin: 1 mg/dL (ref 0.3–1.2)
Total Protein: 7.8 g/dL (ref 6.5–8.1)

## 2019-05-26 LAB — CBC WITH DIFFERENTIAL/PLATELET
Abs Immature Granulocytes: 0.03 10*3/uL (ref 0.00–0.07)
Basophils Absolute: 0 10*3/uL (ref 0.0–0.1)
Basophils Relative: 0 %
Eosinophils Absolute: 0.4 10*3/uL (ref 0.0–0.5)
Eosinophils Relative: 5 %
HCT: 45.4 % (ref 39.0–52.0)
Hemoglobin: 15.4 g/dL (ref 13.0–17.0)
Immature Granulocytes: 0 %
Lymphocytes Relative: 23 %
Lymphs Abs: 2.1 10*3/uL (ref 0.7–4.0)
MCH: 32.2 pg (ref 26.0–34.0)
MCHC: 33.9 g/dL (ref 30.0–36.0)
MCV: 95 fL (ref 80.0–100.0)
Monocytes Absolute: 0.5 10*3/uL (ref 0.1–1.0)
Monocytes Relative: 6 %
Neutro Abs: 5.8 10*3/uL (ref 1.7–7.7)
Neutrophils Relative %: 66 %
Platelets: 196 10*3/uL (ref 150–400)
RBC: 4.78 MIL/uL (ref 4.22–5.81)
RDW: 12.1 % (ref 11.5–15.5)
WBC: 8.8 10*3/uL (ref 4.0–10.5)
nRBC: 0 % (ref 0.0–0.2)

## 2019-05-26 NOTE — Progress Notes (Signed)
Hematology/Oncology Consult note [facility] [phone] [phone]  Patient Care Team: Marinda Elk, MD as PCP - General (Physician Assistant)   Name of the patient: Bruce Garza  [id number]  1946/06/29    Reason for referral-elevated serum free light chains   Referring physician-Dr. Holley Raring  Date of visit: 05/26/19   History of presenting illness-patient is a 73 year old male with a past medical history significant for hypertension hyperlipidemia and stage III chronic kidney disease for which he sees Dr. Holley Raring.  He has been referred to [place] for elevated free light chains.Most recent 6 CMP from 04/27/2019 showed creatinine of 1.4 and BUN of 25.  Calcium was 9.7.  Electrolytes and LFTs were normal.  CBC showed a white count of 8.7, H&H of 15.9/45.9 and a platelet count of 157.  Patient feels well overall denies any complaints at this time  ECOG PS- 1  Pain scale- 0   Review of systems- Review of Systems  Constitutional: Negative for chills, fever, malaise/fatigue and weight loss.  HENT: Negative for congestion, ear discharge and nosebleeds.   Eyes: Negative for blurred vision.  Respiratory: Negative for cough, hemoptysis, sputum production, shortness of breath and wheezing.   Cardiovascular: Negative for chest pain, palpitations, orthopnea and claudication.  Gastrointestinal: Negative for abdominal pain, blood in stool, constipation, diarrhea, heartburn, melena, nausea and vomiting.  Genitourinary: Negative for dysuria, flank pain, frequency, hematuria and urgency.  Musculoskeletal: Negative for back pain, joint pain and myalgias.  Skin: Negative for rash.  Neurological: Negative for dizziness, tingling, focal weakness, seizures, weakness and headaches.  Endo/Heme/Allergies: Does not bruise/bleed easily.  Psychiatric/Behavioral: Negative for depression and suicidal ideas. The patient does not have insomnia.     No Known  Allergies  There are no active problems to display for this patient.    Past Medical History:  Diagnosis Date  . History of shingles   . Hyperlipidemia   . Hypertension      Past Surgical History:  Procedure Laterality Date  . COLONOSCOPY WITH PROPOFOL N/A 07/22/2015   Procedure: COLONOSCOPY WITH PROPOFOL;  Surgeon: Lollie Sails, MD;  Location: [facility] ENDOSCOPY;  Service: Endoscopy;  Laterality: N/A;  . excision ganglion cyst wrist      Social History   Socioeconomic History  . Marital status: Married    Spouse name: Not on file  . Number of children: Not on file  . Years of education: Not on file  . Highest education level: Not on file  Occupational History  . Not on file  Social Needs  . Financial resource strain: Not on file  . Food insecurity    Worry: Not on file    Inability: Not on file  . Transportation needs    Medical: Not on file    Non-medical: Not on file  Tobacco Use  . Smoking status: Never Smoker  Substance and Sexual Activity  . Alcohol use: Not on file  . Drug use: Not on file  . Sexual activity: Not on file  Lifestyle  . Physical activity    Days per week: Not on file    Minutes per session: Not on file  . Stress: Not on file  Relationships  . Social Herbalist on phone: Not on file    Gets together: Not on file    Attends religious service: Not on file    Active member of club or organization: Not on file    Attends meetings of clubs or  organizations: Not on file    Relationship status: Not on file  . Intimate partner violence    Fear of current or ex partner: Not on file    Emotionally abused: Not on file    Physically abused: Not on file    Forced sexual activity: Not on file  Other Topics Concern  . Not on file  Social History Narrative  . Not on file     History reviewed. No pertinent family history.   Current Outpatient Medications:  .  ascorbic acid (VITAMIN C) 1000 MG tablet, Take 1,000 mg by mouth daily.,  Disp: , Rfl:  .  Cholecalciferol (VITAMIN D3) 25 MCG (1000 UT) CAPS, Take by mouth., Disp: , Rfl:  .  dorzolamide-timolol (COSOPT) 22.3-6.8 MG/ML ophthalmic solution, 1 drop 2 (two) times daily., Disp: , Rfl:  .  lisinopril-hydrochlorothiazide (PRINZIDE,ZESTORETIC) 20-12.5 MG tablet, Take 1 tablet by mouth daily., Disp: , Rfl:  .  Multiple Vitamins-Minerals (OCUVITE ADULT 50+ PO), Take by mouth., Disp: , Rfl:  .  pravastatin (PRAVACHOL) 10 MG tablet, Take 10 mg by mouth daily., Disp: , Rfl:  .  prednisoLONE acetate (PRED FORTE) 1 % ophthalmic suspension, 1 drop 4 (four) times daily., Disp: , Rfl:    Physical exam:  Vitals:   05/26/19 1021  BP: 139/79  Pulse: 62  Resp: 16  Temp: (!) 96.4 F (35.8 C)  Weight: 183 lb 14.4 oz (83.4 kg)   Physical Exam Constitutional:      General: He is not in acute distress. HENT:     Head: Normocephalic and atraumatic.  Eyes:     Pupils: Pupils are equal, round, and reactive to light.  Neck:     Musculoskeletal: Normal range of motion.  Cardiovascular:     Rate and Rhythm: Normal rate and regular rhythm.     Heart sounds: Normal heart sounds.  Pulmonary:     Effort: Pulmonary effort is normal.     Breath sounds: Normal breath sounds.  Abdominal:     General: Bowel sounds are normal.     Palpations: Abdomen is soft.  Lymphadenopathy:     Comments: No palpable cervical, supraclavicular, axillary or inguinal adenopathy   Skin:    General: Skin is warm and dry.  Neurological:     Mental Status: He is alert and oriented to person, place, and time.        CMP Latest Ref Rng & Units 05/26/2019  Glucose 70 - 99 mg/dL 141(H)  BUN 8 - 23 mg/dL 24(H)  Creatinine 0.61 - 1.24 mg/dL 1.51(H)  Sodium 135 - 145 mmol/L 138  Potassium 3.5 - 5.1 mmol/L 4.3  Chloride 98 - 111 mmol/L 105  CO2 22 - 32 mmol/L 25  Calcium 8.9 - 10.3 mg/dL 9.3  Total Protein 6.5 - 8.1 g/dL 7.8  Total Bilirubin 0.3 - 1.2 mg/dL 1.0  Alkaline Phos 38 - 126 U/L 74  AST 15  - 41 U/L 23  ALT 0 - 44 U/L 23   CBC Latest Ref Rng & Units 05/26/2019  WBC 4.0 - 10.5 K/uL 8.8  Hemoglobin 13.0 - 17.0 g/dL 15.4  Hematocrit 39.0 - 52.0 % 45.4  Platelets 150 - 400 K/uL 196     Assessment and plan- Patient is a 73 y.o. male referred for elevated light chains  Patient is not anemic and does not have hypercalcemia. spep showed no M protein. Suspect elevated light chains is due to CKD and not the cause of CKD. I will  check cbc with diff, CMP, myeloma panel, serum free light chains and 24 hour urine protein electropheresis.   I will see him back in 2 weeks time   Thank you for this kind referral and the opportunity to participate in the care of this patient   Visit Diagnosis 1. Elevated serum immunoglobulin free light chain level     Dr. Randa Evens, MD, MPH [facility] [phone] 05/26/2019  1:30 PM

## 2019-05-27 DIAGNOSIS — Z79899 Other long term (current) drug therapy: Secondary | ICD-10-CM | POA: Diagnosis not present

## 2019-05-27 DIAGNOSIS — I10 Essential (primary) hypertension: Secondary | ICD-10-CM | POA: Diagnosis not present

## 2019-05-27 DIAGNOSIS — E785 Hyperlipidemia, unspecified: Secondary | ICD-10-CM | POA: Diagnosis not present

## 2019-05-27 DIAGNOSIS — E8581 Light chain (AL) amyloidosis: Secondary | ICD-10-CM | POA: Diagnosis not present

## 2019-05-27 LAB — MULTIPLE MYELOMA PANEL, SERUM
Albumin SerPl Elph-Mcnc: 4.1 g/dL (ref 2.9–4.4)
Albumin/Glob SerPl: 1.5 (ref 0.7–1.7)
Alpha 1: 0.2 g/dL (ref 0.0–0.4)
Alpha2 Glob SerPl Elph-Mcnc: 0.7 g/dL (ref 0.4–1.0)
B-Globulin SerPl Elph-Mcnc: 1.2 g/dL (ref 0.7–1.3)
Gamma Glob SerPl Elph-Mcnc: 0.9 g/dL (ref 0.4–1.8)
Globulin, Total: 2.9 g/dL (ref 2.2–3.9)
IgA: 383 mg/dL (ref 61–437)
IgG (Immunoglobin G), Serum: 918 mg/dL (ref 603–1613)
IgM (Immunoglobulin M), Srm: 56 mg/dL (ref 15–143)
Total Protein ELP: 7 g/dL (ref 6.0–8.5)

## 2019-05-27 LAB — KAPPA/LAMBDA LIGHT CHAINS
Kappa free light chain: 61 mg/L — ABNORMAL HIGH (ref 3.3–19.4)
Kappa, lambda light chain ratio: 3.13 — ABNORMAL HIGH (ref 0.26–1.65)
Lambda free light chains: 19.5 mg/L (ref 5.7–26.3)

## 2019-05-28 ENCOUNTER — Other Ambulatory Visit: Payer: Self-pay

## 2019-05-28 DIAGNOSIS — R768 Other specified abnormal immunological findings in serum: Secondary | ICD-10-CM

## 2019-05-28 LAB — IMMUNOFIXATION, URINE

## 2019-05-29 LAB — UPEP/TP, 24-HR URINE
Albumin, U: 100 %
Alpha 1, Urine: 0 %
Alpha 2, Urine: 0 %
Beta, Urine: 0 %
Gamma Globulin, Urine: 0 %
Total Protein, Urine-Ur/day: 61 mg/24 hr (ref 30–150)
Total Protein, Urine: 4.7 mg/dL
Total Volume: 1300

## 2019-06-01 DIAGNOSIS — N183 Chronic kidney disease, stage 3 (moderate): Secondary | ICD-10-CM | POA: Diagnosis not present

## 2019-06-01 DIAGNOSIS — I10 Essential (primary) hypertension: Secondary | ICD-10-CM | POA: Diagnosis not present

## 2019-06-01 DIAGNOSIS — D472 Monoclonal gammopathy: Secondary | ICD-10-CM | POA: Diagnosis not present

## 2019-06-08 DIAGNOSIS — N183 Chronic kidney disease, stage 3 (moderate): Secondary | ICD-10-CM | POA: Diagnosis not present

## 2019-06-08 DIAGNOSIS — I10 Essential (primary) hypertension: Secondary | ICD-10-CM | POA: Diagnosis not present

## 2019-06-09 ENCOUNTER — Other Ambulatory Visit: Payer: Self-pay

## 2019-06-09 ENCOUNTER — Telehealth: Payer: Self-pay | Admitting: *Deleted

## 2019-06-09 ENCOUNTER — Encounter: Payer: Self-pay | Admitting: Oncology

## 2019-06-09 ENCOUNTER — Inpatient Hospital Stay (HOSPITAL_BASED_OUTPATIENT_CLINIC_OR_DEPARTMENT_OTHER): Payer: Medicare HMO | Admitting: Oncology

## 2019-06-09 VITALS — BP 111/71 | HR 59 | Temp 96.9°F | Resp 16 | Wt 185.8 lb

## 2019-06-09 DIAGNOSIS — R768 Other specified abnormal immunological findings in serum: Secondary | ICD-10-CM | POA: Diagnosis not present

## 2019-06-09 DIAGNOSIS — I10 Essential (primary) hypertension: Secondary | ICD-10-CM | POA: Diagnosis not present

## 2019-06-09 DIAGNOSIS — Z79899 Other long term (current) drug therapy: Secondary | ICD-10-CM | POA: Diagnosis not present

## 2019-06-09 DIAGNOSIS — E8581 Light chain (AL) amyloidosis: Secondary | ICD-10-CM | POA: Diagnosis not present

## 2019-06-09 DIAGNOSIS — E785 Hyperlipidemia, unspecified: Secondary | ICD-10-CM | POA: Diagnosis not present

## 2019-06-09 NOTE — Progress Notes (Signed)
Pt has no symptoms from abnormal labs

## 2019-06-09 NOTE — Progress Notes (Signed)
Hematology/Oncology Consult note [facility]  Telephone:([phone] Fax:([phone]  Patient Care Team: Marinda Elk, MD as PCP - General (Physician Assistant)   Name of the patient: Bruce Garza  [id number]  1946-10-08   Date of visit: 06/09/19  Diagnosis-elevated free light chains  Chief complaint/ Reason for visit-discuss results of blood work  Heme/Onc history: patient is a 73 year old male with a past medical history significant for hypertension hyperlipidemia and stage III chronic kidney disease for which he sees Dr. Holley Raring.  He has been referred to [place] for elevated free light chains.Most recent 6 CMP from 04/27/2019 showed creatinine of 1.4 and BUN of 25.  Calcium was 9.7.  Electrolytes and LFTs were normal.  CBC showed a white count of 8.7, H&H of 15.9/45.9 and a platelet count of 157.  Results of blood work from 05/26/2019 were as follows: CBC was normal with an H&H of 15.4/45.4.  CMP was significant for serum creatinine of 1.5.  Calcium normal at 9.3.  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  Free light chain showed elevated kappa of 61 with a free light chain ratio of 3.1.  24-hour urine protein electrophoresis did not reveal any evidence of M protein.  Interval history- he feels overall. Denies any complaints today  ECOG PS- 1 Pain scale- 0   Review of systems- Review of Systems  Constitutional: Negative for chills, fever, malaise/fatigue and weight loss.  HENT: Negative for congestion, ear discharge and nosebleeds.   Eyes: Negative for blurred vision.  Respiratory: Negative for cough, hemoptysis, sputum production, shortness of breath and wheezing.   Cardiovascular: Negative for chest pain, palpitations, orthopnea and claudication.  Gastrointestinal: Negative for abdominal pain, blood in stool, constipation, diarrhea, heartburn, melena, nausea and vomiting.   Genitourinary: Negative for dysuria, flank pain, frequency, hematuria and urgency.  Musculoskeletal: Negative for back pain, joint pain and myalgias.  Skin: Negative for rash.  Neurological: Negative for dizziness, tingling, focal weakness, seizures, weakness and headaches.  Endo/Heme/Allergies: Does not bruise/bleed easily.  Psychiatric/Behavioral: Negative for depression and suicidal ideas. The patient does not have insomnia.       Allergies  Allergen Reactions  . Lisinopril Swelling    Facial     Past Medical History:  Diagnosis Date  . Elevated serum immunoglobulin free light chains   . History of shingles   . Hyperlipidemia   . Hypertension      Past Surgical History:  Procedure Laterality Date  . COLONOSCOPY WITH PROPOFOL N/A 07/22/2015   Procedure: COLONOSCOPY WITH PROPOFOL;  Surgeon: Lollie Sails, MD;  Location: [facility] ENDOSCOPY;  Service: Endoscopy;  Laterality: N/A;  . excision ganglion cyst wrist      Social History   Socioeconomic History  . Marital status: Married    Spouse name: Not on file  . Number of children: Not on file  . Years of education: Not on file  . Highest education level: Not on file  Occupational History  . Not on file  Social Needs  . Financial resource strain: Not on file  . Food insecurity    Worry: Not on file    Inability: Not on file  . Transportation needs    Medical: Not on file    Non-medical: Not on file  Tobacco Use  . Smoking status: Never Smoker  . Smokeless tobacco: Never Used  Substance and Sexual Activity  . Alcohol use: Never  Frequency: Never  . Drug use: Never  . Sexual activity: Not on file  Lifestyle  . Physical activity    Days per week: Not on file    Minutes per session: Not on file  . Stress: Not on file  Relationships  . Social Herbalist on phone: Not on file    Gets together: Not on file    Attends religious service: Not on file    Active member of club or organization: Not on  file    Attends meetings of clubs or organizations: Not on file    Relationship status: Not on file  . Intimate partner violence    Fear of current or ex partner: Not on file    Emotionally abused: Not on file    Physically abused: Not on file    Forced sexual activity: Not on file  Other Topics Concern  . Not on file  Social History Narrative  . Not on file    History reviewed. No pertinent family history.   Current Outpatient Medications:  .  ascorbic acid (VITAMIN C) 1000 MG tablet, Take 1,000 mg by mouth daily., Disp: , Rfl:  .  Cholecalciferol (VITAMIN D3) 25 MCG (1000 UT) CAPS, Take by mouth., Disp: , Rfl:  .  dorzolamide-timolol (COSOPT) 22.3-6.8 MG/ML ophthalmic solution, 1 drop 2 (two) times daily., Disp: , Rfl:  .  Multiple Vitamins-Minerals (OCUVITE ADULT 50+ PO), Take by mouth., Disp: , Rfl:  .  pravastatin (PRAVACHOL) 10 MG tablet, Take 10 mg by mouth daily., Disp: , Rfl:  .  prednisoLONE acetate (PRED FORTE) 1 % ophthalmic suspension, 1 drop 4 (four) times daily., Disp: , Rfl:   Physical exam:  Vitals:   06/09/19 0945  BP: 111/71  Pulse: (!) 59  Resp: 16  Temp: (!) 96.9 F (36.1 C)  TempSrc: Tympanic  Weight: 185 lb 12.8 oz (84.3 kg)   Physical Exam Constitutional:      General: He is not in acute distress. HENT:     Head: Normocephalic and atraumatic.  Eyes:     Pupils: Pupils are equal, round, and reactive to light.  Neck:     Musculoskeletal: Normal range of motion.  Cardiovascular:     Rate and Rhythm: Normal rate and regular rhythm.     Heart sounds: Normal heart sounds.  Pulmonary:     Effort: Pulmonary effort is normal.     Breath sounds: Normal breath sounds.  Abdominal:     General: Bowel sounds are normal.     Palpations: Abdomen is soft.  Skin:    General: Skin is warm and dry.  Neurological:     Mental Status: He is alert and oriented to person, place, and time.      CMP Latest Ref Rng & Units 05/26/2019  Glucose 70 - 99 mg/dL  141(H)  BUN 8 - 23 mg/dL 24(H)  Creatinine 0.61 - 1.24 mg/dL 1.51(H)  Sodium 135 - 145 mmol/L 138  Potassium 3.5 - 5.1 mmol/L 4.3  Chloride 98 - 111 mmol/L 105  CO2 22 - 32 mmol/L 25  Calcium 8.9 - 10.3 mg/dL 9.3  Total Protein 6.5 - 8.1 g/dL 7.8  Total Bilirubin 0.3 - 1.2 mg/dL 1.0  Alkaline Phos 38 - 126 U/L 74  AST 15 - 41 U/L 23  ALT 0 - 44 U/L 23   CBC Latest Ref Rng & Units 05/26/2019  WBC 4.0 - 10.5 K/uL 8.8  Hemoglobin 13.0 - 17.0 g/dL 15.4  Hematocrit 39.0 - 52.0 % 45.4  Platelets 150 - 400 K/uL 196      Assessment and plan- Patient is a 73 y.o. male referred for elevated free light chains  I discussed the results of the blood work with the patient as well as his wife.  Patient does have elevated free kappa light chains and his serum free light chain ratio is close to 3.  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  He does not have anemia or hypercalcemia but does have mildly elevated creatinine of 1.5. No monoclonal protein was detected in 24-hour urine protein electrophoresis. Given the abnormal serum free light chain ratio I do recommend getting a bone marrow biopsy to rule out underlying multiple myeloma.  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  I will tentatively see the patient back in 2 weeks time after the bone marrow results are back and discuss further management at that time   Visit Diagnosis 1. Elevated serum immunoglobulin free light chains      Dr. Randa Evens, MD, MPH [facility] [phone] 06/09/2019 10:51 AM

## 2019-06-09 NOTE — Telephone Encounter (Signed)
Dr Janese Banks called wife and spoke with her

## 2019-06-09 NOTE — Telephone Encounter (Signed)
Patient wife called stating that patient did not understand anything he was told this morning and she would like a return call to discuss what was said. [phone]

## 2019-06-11 ENCOUNTER — Telehealth: Payer: Self-pay | Admitting: *Deleted

## 2019-06-11 NOTE — Telephone Encounter (Signed)
Left vm for patient- requesting call back for additional appts.     Bone marrow 9/1 and f/u with Dr. Janese Banks 9/10. Sharma Lawrance, RN Will attempt to reach patient at a later time.

## 2019-06-11 NOTE — Telephone Encounter (Signed)
Spoke with pt's wife. bx instructions and apts times provided to patient's wife. Wife read back the instructions and verbalized understanding.

## 2019-06-15 ENCOUNTER — Other Ambulatory Visit: Payer: Self-pay | Admitting: Physician Assistant

## 2019-06-16 ENCOUNTER — Other Ambulatory Visit (HOSPITAL_COMMUNITY)
Admission: RE | Admit: 2019-06-16 | Discharge: 2019-06-16 | Disposition: A | Discharge status: Home / Self Care | Payer: Medicare HMO | Source: Ambulatory Visit | Attending: Oncology | Admitting: Oncology

## 2019-06-16 ENCOUNTER — Ambulatory Visit
Admission: RE | Admit: 2019-06-16 | Discharge: 2019-06-16 | Disposition: A | Discharge status: Home / Self Care | Payer: Medicare HMO | Source: Ambulatory Visit | Attending: Oncology | Admitting: Oncology

## 2019-06-16 ENCOUNTER — Other Ambulatory Visit: Payer: Self-pay

## 2019-06-16 DIAGNOSIS — Z7952 Long term (current) use of systemic steroids: Secondary | ICD-10-CM | POA: Diagnosis not present

## 2019-06-16 DIAGNOSIS — I10 Essential (primary) hypertension: Secondary | ICD-10-CM | POA: Diagnosis not present

## 2019-06-16 DIAGNOSIS — R768 Other specified abnormal immunological findings in serum: Secondary | ICD-10-CM | POA: Insufficient documentation

## 2019-06-16 DIAGNOSIS — E785 Hyperlipidemia, unspecified: Secondary | ICD-10-CM | POA: Insufficient documentation

## 2019-06-16 DIAGNOSIS — Z888 Allergy status to other drugs, medicaments and biological substances status: Secondary | ICD-10-CM | POA: Insufficient documentation

## 2019-06-16 DIAGNOSIS — Z79899 Other long term (current) drug therapy: Secondary | ICD-10-CM | POA: Insufficient documentation

## 2019-06-16 DIAGNOSIS — D472 Monoclonal gammopathy: Secondary | ICD-10-CM | POA: Insufficient documentation

## 2019-06-16 LAB — CBC WITH DIFFERENTIAL/PLATELET
Abs Immature Granulocytes: 0.02 10*3/uL (ref 0.00–0.07)
Basophils Absolute: 0 10*3/uL (ref 0.0–0.1)
Basophils Relative: 0 %
Eosinophils Absolute: 0.5 10*3/uL (ref 0.0–0.5)
Eosinophils Relative: 6 %
HCT: 42.9 % (ref 39.0–52.0)
Hemoglobin: 14.6 g/dL (ref 13.0–17.0)
Immature Granulocytes: 0 %
Lymphocytes Relative: 31 %
Lymphs Abs: 2.8 10*3/uL (ref 0.7–4.0)
MCH: 32.8 pg (ref 26.0–34.0)
MCHC: 34 g/dL (ref 30.0–36.0)
MCV: 96.4 fL (ref 80.0–100.0)
Monocytes Absolute: 0.6 10*3/uL (ref 0.1–1.0)
Monocytes Relative: 7 %
Neutro Abs: 5 10*3/uL (ref 1.7–7.7)
Neutrophils Relative %: 56 %
Platelets: 194 10*3/uL (ref 150–400)
RBC: 4.45 MIL/uL (ref 4.22–5.81)
RDW: 12.3 % (ref 11.5–15.5)
WBC: 9 10*3/uL (ref 4.0–10.5)
nRBC: 0 % (ref 0.0–0.2)

## 2019-06-16 LAB — PROTIME-INR
INR: 1 (ref 0.8–1.2)
Prothrombin Time: 13.1 seconds (ref 11.4–15.2)

## 2019-06-16 MED ORDER — MIDAZOLAM HCL 5 MG/5ML IJ SOLN
INTRAMUSCULAR | Status: AC
  Filled 2019-06-16: qty 5

## 2019-06-16 MED ORDER — FENTANYL CITRATE (PF) 100 MCG/2ML IJ SOLN
INTRAMUSCULAR | Status: AC
  Filled 2019-06-16: qty 2

## 2019-06-16 MED ORDER — MIDAZOLAM HCL 2 MG/2ML IJ SOLN
INTRAMUSCULAR | Status: AC | PRN
  Administered 2019-06-16: 2 mg via INTRAVENOUS

## 2019-06-16 MED ORDER — FENTANYL CITRATE (PF) 100 MCG/2ML IJ SOLN
INTRAMUSCULAR | Status: AC | PRN
  Administered 2019-06-16: 50 ug via INTRAVENOUS

## 2019-06-16 MED ORDER — SODIUM CHLORIDE 0.9 % IV SOLN
INTRAVENOUS | Status: DC
  Administered 2019-06-16: 20 mL/h via INTRAVENOUS

## 2019-06-16 MED ORDER — SODIUM CHLORIDE 0.9 % IV BOLUS
250.0000 mL | Freq: Once | INTRAVENOUS | Status: AC
  Administered 2019-06-16: 10:00:00 250 mL via INTRAVENOUS

## 2019-06-16 MED ORDER — HEPARIN SOD (PORK) LOCK FLUSH 100 UNIT/ML IV SOLN
INTRAVENOUS | Status: AC
  Filled 2019-06-16: qty 5

## 2019-06-16 NOTE — Progress Notes (Signed)
Pt stable after procedure.VSS.Back stable.F/U with his MD.D/C instructions given.

## 2019-06-16 NOTE — Discharge Instructions (Signed)
Moderate Conscious Sedation, Adult, Care After °These instructions provide you with information about caring for yourself after your procedure. Your health care provider may also give you more specific instructions. Your treatment has been planned according to current medical practices, but problems sometimes occur. Call your health care provider if you have any problems or questions after your procedure. °What can I expect after the procedure? °After your procedure, it is common: °· To feel sleepy for several hours. °· To feel clumsy and have poor balance for several hours. °· To have poor judgment for several hours. °· To vomit if you eat too soon. °Follow these instructions at home: °For at least 24 hours after the procedure: ° °· Do not: °? Participate in activities where you could fall or become injured. °? Drive. °? Use heavy machinery. °? Drink alcohol. °? Take sleeping pills or medicines that cause drowsiness. °? Make important decisions or sign legal documents. °? Take care of children on your own. °· Rest. °Eating and drinking °· Follow the diet recommended by your health care provider. °· If you vomit: °? Drink water, juice, or soup when you can drink without vomiting. °? Make sure you have little or no nausea before eating solid foods. °General instructions °· Have a responsible adult stay with you until you are awake and alert. °· Take over-the-counter and prescription medicines only as told by your health care provider. °· If you smoke, do not smoke without supervision. °· Keep all follow-up visits as told by your health care provider. This is important. °Contact a health care provider if: °· You keep feeling nauseous or you keep vomiting. °· You feel light-headed. °· You develop a rash. °· You have a fever. °Get help right away if: °· You have trouble breathing. °This information is not intended to replace advice given to you by your health care provider. Make sure you discuss any questions you have  with your health care provider. °Document Released: 07/22/2013 Document Revised: 09/13/2017 Document Reviewed: 01/21/2016 °Elsevier Patient Education © 2020 [employer] ° ° °Bone Marrow Aspiration and Bone Marrow Biopsy, Adult, Care After °This sheet gives you information about how to care for yourself after your procedure. Your health care provider may also give you more specific instructions. If you have problems or questions, contact your health care provider. °What can I expect after the procedure? °After the procedure, it is common to have: °· Mild pain and tenderness. °· Swelling. °· Bruising. °Follow these instructions at home: °Puncture site care ° °  ° °· Follow instructions from your health care provider about how to take care of the puncture site. Make sure you: °? Wash your hands with soap and water before you change your bandage (dressing). If soap and water are not available, use hand sanitizer. °? Change your dressing as told by your health care provider. °· Check your puncture site every day for signs of infection. Check for: °? More redness, swelling, or pain. °? More fluid or blood. °? Warmth. °? Pus or a bad smell. °General instructions °· Take over-the-counter and prescription medicines only as told by your health care provider. °· Do not take baths, swim, or use a hot tub until your health care provider approves. Ask if you can take a shower or have a sponge bath. °· Return to your normal activities as told by your health care provider. Ask your health care provider what activities are safe for you. °· Do not drive for 24 hours if you were   medicine to help you relax (sedative) during your procedure.  Keep all follow-up visits as told by your health care provider. This is important. Contact a health care provider if:  Your pain is not controlled with medicine. Get help right away if:  You have a fever.  You have more redness, swelling, or pain around the puncture site.  You have  more fluid or blood coming from the puncture site.  Your puncture site feels warm to the touch.  You have pus or a bad smell coming from the puncture site. These symptoms may represent a serious problem that is an emergency. Do not wait to see if the symptoms will go away. Get medical help right away. Call your local emergency services (911 in the [place] Do not drive yourself to the hospital. Summary  After the procedure, it is common to have mild pain, tenderness, swelling, and bruising.  Follow instructions from your health care provider about how to take care of the puncture site.  Get help right away if you have any symptoms of infection or if you have more blood or fluid coming from the puncture site. This information is not intended to replace advice given to you by your health care provider. Make sure you discuss any questions you have with your health care provider. Document Released: 04/20/2005 Document Revised: 01/14/2018 Document Reviewed: 03/14/2016 Elsevier Patient Education  2020 [employer].

## 2019-06-16 NOTE — Progress Notes (Signed)
Patient clinically stable post BMB per DR Register, tolerated well. Vitals stable throughout entire procedure. bandade intact,c/d. Received Versed 2mg  along with Fentanyl 24mcg iv for procedure. To specials for recovery prior to discharge.

## 2019-06-16 NOTE — H&P (Signed)
Chief Complaint: Patient was seen in consultation today for bone marrow biopsy at the request of Juncal C  Referring Physician(s): Rao,Archana C  Supervising Physician: Dr. Register  Patient Status: [place]  History of Present Illness: Bruce Garza is a 73 y.o. male being worked for [employer] with elevated free light chains. He is referred for bone marrow biopsy. PMHx, meds, labs, imaging, allergies reviewed. Feels well, no recent fevers, chills, illness. Has been NPO today as directed.    Past Medical History:  Diagnosis Date   Elevated serum immunoglobulin free light chains    History of shingles    Hyperlipidemia    Hypertension     Past Surgical History:  Procedure Laterality Date   COLONOSCOPY WITH PROPOFOL N/A 07/22/2015   Procedure: COLONOSCOPY WITH PROPOFOL;  Surgeon: Lollie Sails, MD;  Location: [facility] ENDOSCOPY;  Service: Endoscopy;  Laterality: N/A;   excision ganglion cyst wrist      Allergies: Lisinopril  Medications: Prior to Admission medications   Medication Sig Start Date End Date Taking? Authorizing Provider  amLODipine (NORVASC) 10 MG tablet Take 10 mg by mouth daily.   Yes [provider]  ascorbic acid (VITAMIN C) 1000 MG tablet Take 1,000 mg by mouth daily.   Yes [provider]  Cholecalciferol (VITAMIN D3) 25 MCG (1000 UT) CAPS Take by mouth.   Yes [provider]  dorzolamide-timolol (COSOPT) 22.3-6.8 MG/ML ophthalmic solution 1 drop 2 (two) times daily.   Yes [provider]  losartan (COZAAR) 25 MG tablet Take 25 mg by mouth daily. Every evening   Yes [provider]  pravastatin (PRAVACHOL) 10 MG tablet Take 10 mg by mouth daily.   Yes [provider]  prednisoLONE acetate (PRED FORTE) 1 % ophthalmic suspension 1 drop 4 (four) times daily.   Yes [provider]  spironolactone (ALDACTONE) 25 MG tablet Take 25 mg by mouth 2 (two) times daily.   Yes [provider]  Multiple Vitamins-Minerals (OCUVITE ADULT 50+ PO) Take by mouth.    [provider]     No family history on file.  Social History   Socioeconomic History   Marital status: Married    Spouse name: pam   Number of children: Not on file   Years of education: Not on file   Highest education level: Not on file  Occupational History   Not on file  Social Needs   Financial resource strain: Not on file   Food insecurity    Worry: Not on file    Inability: Not on file   Transportation needs    Medical: Not on file    Non-medical: Not on file  Tobacco Use   Smoking status: Never Smoker   Smokeless tobacco: Never Used  Substance and Sexual Activity   Alcohol use: Never    Frequency: Never   Drug use: Never   Sexual activity: Not on file  Lifestyle   Physical activity    Days per week: Not on file    Minutes per session: Not on file   Stress: Not on file  Relationships   Social connections    Talks on phone: Not on file    Gets together: Not on file    Attends religious service: Not on file    Active member of club or organization: Not on file    Attends meetings of clubs or organizations: Not on file    Relationship status: Not on file  Other  Topics Concern   Not on file  Social History Narrative   Not on file    Review of Systems: A 12 point ROS discussed and pertinent positives are indicated in the HPI above.  All other systems are negative.  Review of Systems  Vital Signs: BP 126/67    Pulse 78    Temp 97.9 F (36.6 C) (Oral)    Resp 12    Ht _0  (1.676 m)    Wt 83 kg    SpO2 97%    BMI 29.54 kg/m   Physical Exam Constitutional:      Appearance: Normal appearance. He is not ill-appearing.  HENT:     Mouth/Throat:     Mouth: Mucous membranes are moist.     Pharynx: Oropharynx is clear.  Cardiovascular:     Rate and Rhythm: Normal rate and regular rhythm.     Heart sounds: Normal heart sounds.  Pulmonary:      Effort: Pulmonary effort is normal. No respiratory distress.     Breath sounds: Normal breath sounds.  Skin:    General: Skin is warm and dry.     Findings: No bruising.  Neurological:     General: No focal deficit present.     Mental Status: He is alert and oriented to person, place, and time.  Psychiatric:        Mood and Affect: Mood normal.        Judgment: Judgment normal.     Imaging: No results found.  Labs:  CBC: Recent Labs    05/26/19 1106  WBC 8.8  HGB 15.4  HCT 45.4  PLT 196    COAGS: No results for input(s): INR, APTT in the last 8760 hours.  BMP: Recent Labs    05/26/19 1106  NA 138  K 4.3  CL 105  CO2 25  GLUCOSE 141*  BUN 24*  CALCIUM 9.3  CREATININE 1.51*  GFRNONAA 45*  GFRAA 52*    LIVER FUNCTION TESTS: Recent Labs    05/26/19 1106  BILITOT 1.0  AST 23  ALT 23  ALKPHOS 74  PROT 7.8  ALBUMIN 4.5    TUMOR MARKERS: No results for input(s): AFPTM, CEA, CA199, CHROMGRNA in the last 8760 hours.  Assessment and Plan: [employer] Elevated free light chains For CT guided bone marrow biopsy Labs pending Risks and benefits of bone marrow biopsy was discussed with the patient and/or patient's family including, but not limited to bleeding, infection, damage to adjacent structures or low yield requiring additional tests.  All of the questions were answered and there is agreement to proceed.  Consent signed and in chart.    Thank you for this interesting consult.  I greatly enjoyed meeting Bruce Garza and look forward to participating in their care.  A copy of this report was sent to the requesting provider on this date.  Electronically Signed: Ascencion Dike, PA-C 06/16/2019, 8:13 AM   I spent a total of 20 Minutes  in face to face in clinical consultation, greater than 50% of which was counseling/coordinating care for bone marrow biopsy

## 2019-06-23 ENCOUNTER — Encounter (HOSPITAL_COMMUNITY): Payer: Self-pay | Admitting: Oncology

## 2019-06-23 ENCOUNTER — Ambulatory Visit: Payer: Medicare HMO | Admitting: Oncology

## 2019-06-24 NOTE — Progress Notes (Signed)
Spoke with wife, she helped me get patient checked in for visit.  [phone] Bruce Garza please call wife during visit

## 2019-06-25 ENCOUNTER — Encounter: Payer: Self-pay | Admitting: Oncology

## 2019-06-25 ENCOUNTER — Inpatient Hospital Stay: Payer: Medicare HMO | Attending: Oncology | Admitting: Oncology

## 2019-06-25 ENCOUNTER — Other Ambulatory Visit: Payer: Self-pay

## 2019-06-25 ENCOUNTER — Encounter (HOSPITAL_COMMUNITY): Payer: Self-pay | Admitting: Oncology

## 2019-06-25 VITALS — BP 161/71 | HR 80 | Resp 18 | Wt 184.0 lb

## 2019-06-25 DIAGNOSIS — Z79899 Other long term (current) drug therapy: Secondary | ICD-10-CM | POA: Insufficient documentation

## 2019-06-25 DIAGNOSIS — D472 Monoclonal gammopathy: Secondary | ICD-10-CM | POA: Diagnosis not present

## 2019-06-25 DIAGNOSIS — E785 Hyperlipidemia, unspecified: Secondary | ICD-10-CM | POA: Diagnosis not present

## 2019-06-25 DIAGNOSIS — I129 Hypertensive chronic kidney disease with stage 1 through stage 4 chronic kidney disease, or unspecified chronic kidney disease: Secondary | ICD-10-CM | POA: Insufficient documentation

## 2019-06-25 DIAGNOSIS — N183 Chronic kidney disease, stage 3 (moderate): Secondary | ICD-10-CM | POA: Insufficient documentation

## 2019-06-28 NOTE — Progress Notes (Signed)
Hematology/Oncology Consult note [facility]  [phone] [phone]  Patient Care Team: Marinda Elk, MD as PCP - General (Physician Assistant)   Name of the patient: Bruce Garza  [id number]  Nov 22, 1945   Date of visit: 06/28/19  Diagnosis- light chain MGUS  Chief complaint/ Reason for visit-discuss bone marrow biopsy results  Heme/Onc history: patient is a 73 year old male with a past medical history significant for hypertension hyperlipidemia and stage III chronic kidney disease for which he sees Dr. Holley Raring. He has been referred to [place] for elevated free light chains.Most recent 6 CMP from 04/27/2019 showed creatinine of 1.4 and BUN of 25. Calcium was 9.7. Electrolytes and LFTs were normal. CBC showed a white count of 8.7, H&H of 15.9/45.9 and a platelet count of 157.  Results of blood work from 05/26/2019 were as follows: CBC was normal with an H&H of 15.4/45.4.  CMP was significant for serum creatinine of 1.5.  Calcium normal at 9.3.  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  Free light chain showed elevated kappa of 61 with a free light chain ratio of 3.1.  24-hour urine protein electrophoresis did not reveal any evidence of M protein.  Patient underwent bone marrow biopsy given the elevated free light chain ratio which showed 3% plasma cells but was not consistent with multiple myeloma and consistent with light chain MGUS  Interval history-patient overall feels well and denies any complaints at this time  ECOG PS- 1 Pain scale- 0   Review of systems- Review of Systems  Constitutional: Negative for chills, fever, malaise/fatigue and weight loss.  HENT: Negative for congestion, ear discharge and nosebleeds.   Eyes: Negative for blurred vision.  Respiratory: Negative for cough, hemoptysis, sputum production, shortness of breath and wheezing.     Cardiovascular: Negative for chest pain, palpitations, orthopnea and claudication.  Gastrointestinal: Negative for abdominal pain, blood in stool, constipation, diarrhea, heartburn, melena, nausea and vomiting.  Genitourinary: Negative for dysuria, flank pain, frequency, hematuria and urgency.  Musculoskeletal: Negative for back pain, joint pain and myalgias.  Skin: Negative for rash.  Neurological: Negative for dizziness, tingling, focal weakness, seizures, weakness and headaches.  Endo/Heme/Allergies: Does not bruise/bleed easily.  Psychiatric/Behavioral: Negative for depression and suicidal ideas. The patient does not have insomnia.      Allergies  Allergen Reactions   Lisinopril Swelling    Facial     Past Medical History:  Diagnosis Date   Elevated serum immunoglobulin free light chains    History of shingles    Hyperlipidemia    Hypertension      Past Surgical History:  Procedure Laterality Date   COLONOSCOPY WITH PROPOFOL N/A 07/22/2015   Procedure: COLONOSCOPY WITH PROPOFOL;  Surgeon: Lollie Sails, MD;  Location: [facility] ENDOSCOPY;  Service: Endoscopy;  Laterality: N/A;   excision ganglion cyst wrist      Social History   Socioeconomic History   Marital status: Married    Spouse name: pam   Number of children: Not on file   Years of education: Not on file   Highest education level: Not on file  Occupational History   Not on file  Social Needs   Financial resource strain: Not on file   Food insecurity    Worry: Not on file    Inability: Not on file   Transportation needs    Medical: Not on file    Non-medical: Not on  file  Tobacco Use   Smoking status: Never Smoker   Smokeless tobacco: Never Used  Substance and Sexual Activity   Alcohol use: Never    Frequency: Never   Drug use: Never   Sexual activity: Not on file  Lifestyle   Physical activity    Days per week: Not on file    Minutes per session: Not on file   Stress:  Not on file  Relationships   Social connections    Talks on phone: Not on file    Gets together: Not on file    Attends religious service: Not on file    Active member of club or organization: Not on file    Attends meetings of clubs or organizations: Not on file    Relationship status: Not on file   Intimate partner violence    Fear of current or ex partner: Not on file    Emotionally abused: Not on file    Physically abused: Not on file    Forced sexual activity: Not on file  Other Topics Concern   Not on file  Social History Narrative   Not on file    History reviewed. No pertinent family history.   Current Outpatient Medications:    amLODipine (NORVASC) 10 MG tablet, Take 10 mg by mouth daily., Disp: , Rfl:    ascorbic acid (VITAMIN C) 1000 MG tablet, Take 1,000 mg by mouth daily., Disp: , Rfl:    Cholecalciferol (VITAMIN D3) 25 MCG (1000 UT) CAPS, Take by mouth., Disp: , Rfl:    dorzolamide-timolol (COSOPT) 22.3-6.8 MG/ML ophthalmic solution, 1 drop 2 (two) times daily., Disp: , Rfl:    losartan (COZAAR) 25 MG tablet, Take 25 mg by mouth daily. Every evening, Disp: , Rfl:    Multiple Vitamins-Minerals (OCUVITE ADULT 50+ PO), Take by mouth., Disp: , Rfl:    pravastatin (PRAVACHOL) 10 MG tablet, Take 10 mg by mouth daily., Disp: , Rfl:    prednisoLONE acetate (PRED FORTE) 1 % ophthalmic suspension, 1 drop 4 (four) times daily., Disp: , Rfl:    spironolactone (ALDACTONE) 25 MG tablet, Take 25 mg by mouth 2 (two) times daily., Disp: , Rfl:   Physical exam:  Vitals:   06/25/19 1407  BP: (!) 161/71  Pulse: 80  Resp: 18  Weight: 184 lb (83.5 kg)   Physical Exam Constitutional:      General: He is not in acute distress. HENT:     Head: Normocephalic and atraumatic.  Eyes:     Pupils: Pupils are equal, round, and reactive to light.  Neck:     Musculoskeletal: Normal range of motion.  Cardiovascular:     Rate and Rhythm: Normal rate and regular rhythm.      Heart sounds: Normal heart sounds.  Pulmonary:     Effort: Pulmonary effort is normal.     Breath sounds: Normal breath sounds.  Abdominal:     General: Bowel sounds are normal.     Palpations: Abdomen is soft.  Skin:    General: Skin is warm and dry.  Neurological:     Mental Status: He is alert and oriented to person, place, and time.      CMP Latest Ref Rng & Units 05/26/2019  Glucose 70 - 99 mg/dL 141(H)  BUN 8 - 23 mg/dL 24(H)  Creatinine 0.61 - 1.24 mg/dL 1.51(H)  Sodium 135 - 145 mmol/L 138  Potassium 3.5 - 5.1 mmol/L 4.3  Chloride 98 - 111 mmol/L 105  CO2  22 - 32 mmol/L 25  Calcium 8.9 - 10.3 mg/dL 9.3  Total Protein 6.5 - 8.1 g/dL 7.8  Total Bilirubin 0.3 - 1.2 mg/dL 1.0  Alkaline Phos 38 - 126 U/L 74  AST 15 - 41 U/L 23  ALT 0 - 44 U/L 23   CBC Latest Ref Rng & Units 06/16/2019  WBC 4.0 - 10.5 K/uL 9.0  Hemoglobin 13.0 - 17.0 g/dL 14.6  Hematocrit 39.0 - 52.0 % 42.9  Platelets 150 - 400 K/uL 194    No images are attached to the encounter.  Ct Bone Marrow Biopsy & Aspiration  Result Date: 06/16/2019 INDICATION: Elevated light chains.  Evaluation for multiple myeloma. EXAM: CT-DIRECTED BONE MARROW BIOPSY MEDICATIONS: None. ANESTHESIA/SEDATION: Moderate (conscious) sedation was employed during this procedure. A total of Versed 2.0 mg and Fentanyl 50.0 mcg was administered intravenously. Moderate Sedation Time: Approximately 20 minutes. The patient's level of consciousness and vital signs were monitored continuously by radiology nursing throughout the procedure under my direct supervision. FLUOROSCOPY TIME:  Fluoroscopy Time: 0 minutes 0 seconds (0 mGy). COMPLICATIONS: None immediate. PROCEDURE: After discussing the risks and benefits of this procedure with the patient informed consent was obtained. Back was sterilely prepped and draped. Following local anesthesia with 1% lidocaine and IV conscious sedation CT-directed bone marrow aspiration and biopsy was performed using  the on site biopsy system. Two bone marrow aspirates and core biopsies obtained. No complications. Hemostasis achieved following needle removal. Patient sent to recovery room in good condition. IMPRESSION: Successful CT-directed bone marrow aspiration and biopsy. Electronically Signed   By: Marcello Moores  Register   On: 06/16/2019 09:33     Assessment and plan- Patient is a 73 y.o. male with light chain MGUS  Patient has a small IgA monoclonal protein detected on immunofixation but not on SPEP.  He did have an elevated free light chain ratioOf 3.13.  Bone marrow biopsy shows evidence of 3% plasma cells which is consistent with IgA MGUS.  Patient does not require any treatment for this at this time which can be monitored conservatively.  I will see him back in 6 months time and obtain a CBC with differential, CMP, myeloma panel and serum free light chains a week prior.  Discussed natural history of MGUS and a small 1% risk of progression per year to multiple myeloma.  Patient's CKD is not related to his MGUS   Visit Diagnosis 1. MGUS (monoclonal gammopathy of unknown significance)      Dr. Randa Evens, MD, MPH [facility] [phone] 06/28/2019 6:02 PM

## 2019-07-13 DIAGNOSIS — E876 Hypokalemia: Secondary | ICD-10-CM | POA: Diagnosis not present

## 2019-07-13 DIAGNOSIS — R7303 Prediabetes: Secondary | ICD-10-CM | POA: Diagnosis not present

## 2019-07-13 DIAGNOSIS — I10 Essential (primary) hypertension: Secondary | ICD-10-CM | POA: Diagnosis not present

## 2019-07-13 DIAGNOSIS — N183 Chronic kidney disease, stage 3 (moderate): Secondary | ICD-10-CM | POA: Diagnosis not present

## 2019-07-21 DIAGNOSIS — Z9189 Other specified personal risk factors, not elsewhere classified: Secondary | ICD-10-CM | POA: Diagnosis not present

## 2019-07-21 DIAGNOSIS — E876 Hypokalemia: Secondary | ICD-10-CM | POA: Diagnosis not present

## 2019-07-21 DIAGNOSIS — Z0000 Encounter for general adult medical examination without abnormal findings: Secondary | ICD-10-CM | POA: Diagnosis not present

## 2019-07-21 DIAGNOSIS — D472 Monoclonal gammopathy: Secondary | ICD-10-CM | POA: Diagnosis not present

## 2019-07-21 DIAGNOSIS — E782 Mixed hyperlipidemia: Secondary | ICD-10-CM | POA: Diagnosis not present

## 2019-07-21 DIAGNOSIS — N1831 Chronic kidney disease, stage 3a: Secondary | ICD-10-CM | POA: Diagnosis not present

## 2019-07-21 DIAGNOSIS — G4733 Obstructive sleep apnea (adult) (pediatric): Secondary | ICD-10-CM | POA: Diagnosis not present

## 2019-07-21 DIAGNOSIS — I129 Hypertensive chronic kidney disease with stage 1 through stage 4 chronic kidney disease, or unspecified chronic kidney disease: Secondary | ICD-10-CM | POA: Diagnosis not present

## 2019-07-21 DIAGNOSIS — R7303 Prediabetes: Secondary | ICD-10-CM | POA: Diagnosis not present

## 2019-07-27 DIAGNOSIS — H401133 Primary open-angle glaucoma, bilateral, severe stage: Secondary | ICD-10-CM | POA: Diagnosis not present

## 2019-10-06 DIAGNOSIS — G4733 Obstructive sleep apnea (adult) (pediatric): Secondary | ICD-10-CM | POA: Diagnosis not present

## 2019-10-07 DIAGNOSIS — N1831 Chronic kidney disease, stage 3a: Secondary | ICD-10-CM | POA: Diagnosis not present

## 2019-10-07 DIAGNOSIS — D472 Monoclonal gammopathy: Secondary | ICD-10-CM | POA: Diagnosis not present

## 2019-10-07 DIAGNOSIS — I10 Essential (primary) hypertension: Secondary | ICD-10-CM | POA: Diagnosis not present

## 2019-11-02 DIAGNOSIS — R05 Cough: Secondary | ICD-10-CM | POA: Diagnosis not present

## 2019-11-02 DIAGNOSIS — G4733 Obstructive sleep apnea (adult) (pediatric): Secondary | ICD-10-CM | POA: Diagnosis not present

## 2019-11-23 DIAGNOSIS — H401133 Primary open-angle glaucoma, bilateral, severe stage: Secondary | ICD-10-CM | POA: Diagnosis not present

## 2019-11-23 DIAGNOSIS — H353132 Nonexudative age-related macular degeneration, bilateral, intermediate dry stage: Secondary | ICD-10-CM | POA: Diagnosis not present

## 2019-12-22 ENCOUNTER — Other Ambulatory Visit: Payer: Medicare HMO

## 2019-12-23 ENCOUNTER — Other Ambulatory Visit: Payer: Self-pay

## 2019-12-23 ENCOUNTER — Inpatient Hospital Stay: Payer: Medicare HMO | Attending: Oncology

## 2019-12-23 DIAGNOSIS — E785 Hyperlipidemia, unspecified: Secondary | ICD-10-CM | POA: Insufficient documentation

## 2019-12-23 DIAGNOSIS — I129 Hypertensive chronic kidney disease with stage 1 through stage 4 chronic kidney disease, or unspecified chronic kidney disease: Secondary | ICD-10-CM | POA: Insufficient documentation

## 2019-12-23 DIAGNOSIS — D472 Monoclonal gammopathy: Secondary | ICD-10-CM | POA: Insufficient documentation

## 2019-12-23 DIAGNOSIS — Z79899 Other long term (current) drug therapy: Secondary | ICD-10-CM | POA: Insufficient documentation

## 2019-12-23 DIAGNOSIS — N183 Chronic kidney disease, stage 3 unspecified: Secondary | ICD-10-CM | POA: Diagnosis not present

## 2019-12-23 LAB — CBC WITH DIFFERENTIAL/PLATELET
Abs Immature Granulocytes: 0.01 K/uL (ref 0.00–0.07)
Basophils Absolute: 0 K/uL (ref 0.0–0.1)
Basophils Relative: 0 %
Eosinophils Absolute: 0.5 K/uL (ref 0.0–0.5)
Eosinophils Relative: 7 %
HCT: 39.8 % (ref 39.0–52.0)
Hemoglobin: 13 g/dL (ref 13.0–17.0)
Immature Granulocytes: 0 %
Lymphocytes Relative: 25 %
Lymphs Abs: 1.9 K/uL (ref 0.7–4.0)
MCH: 32.5 pg (ref 26.0–34.0)
MCHC: 32.7 g/dL (ref 30.0–36.0)
MCV: 99.5 fL (ref 80.0–100.0)
Monocytes Absolute: 0.6 K/uL (ref 0.1–1.0)
Monocytes Relative: 8 %
Neutro Abs: 4.5 K/uL (ref 1.7–7.7)
Neutrophils Relative %: 60 %
Platelets: 191 K/uL (ref 150–400)
RBC: 4 MIL/uL — ABNORMAL LOW (ref 4.22–5.81)
RDW: 12.1 % (ref 11.5–15.5)
WBC: 7.6 K/uL (ref 4.0–10.5)
nRBC: 0 % (ref 0.0–0.2)

## 2019-12-23 LAB — COMPREHENSIVE METABOLIC PANEL WITH GFR
ALT: 25 U/L (ref 0–44)
AST: 26 U/L (ref 15–41)
Albumin: 4.2 g/dL (ref 3.5–5.0)
Alkaline Phosphatase: 60 U/L (ref 38–126)
Anion gap: 10 (ref 5–15)
BUN: 18 mg/dL (ref 8–23)
CO2: 25 mmol/L (ref 22–32)
Calcium: 8.9 mg/dL (ref 8.9–10.3)
Chloride: 105 mmol/L (ref 98–111)
Creatinine, Ser: 1.46 mg/dL — ABNORMAL HIGH (ref 0.61–1.24)
GFR calc Af Amer: 55 mL/min — ABNORMAL LOW
GFR calc non Af Amer: 47 mL/min — ABNORMAL LOW
Glucose, Bld: 137 mg/dL — ABNORMAL HIGH (ref 70–99)
Potassium: 4.1 mmol/L (ref 3.5–5.1)
Sodium: 140 mmol/L (ref 135–145)
Total Bilirubin: 1 mg/dL (ref 0.3–1.2)
Total Protein: 7 g/dL (ref 6.5–8.1)

## 2019-12-24 LAB — KAPPA/LAMBDA LIGHT CHAINS
Kappa free light chain: 60.5 mg/L — ABNORMAL HIGH (ref 3.3–19.4)
Kappa, lambda light chain ratio: 2.87 — ABNORMAL HIGH (ref 0.26–1.65)
Lambda free light chains: 21.1 mg/L (ref 5.7–26.3)

## 2019-12-28 LAB — MULTIPLE MYELOMA PANEL, SERUM
Albumin SerPl Elph-Mcnc: 4 g/dL (ref 2.9–4.4)
Albumin/Glob SerPl: 1.6 (ref 0.7–1.7)
Alpha 1: 0.2 g/dL (ref 0.0–0.4)
Alpha2 Glob SerPl Elph-Mcnc: 0.6 g/dL (ref 0.4–1.0)
B-Globulin SerPl Elph-Mcnc: 1.1 g/dL (ref 0.7–1.3)
Gamma Glob SerPl Elph-Mcnc: 0.7 g/dL (ref 0.4–1.8)
Globulin, Total: 2.6 g/dL (ref 2.2–3.9)
IgA: 331 mg/dL (ref 61–437)
IgG (Immunoglobin G), Serum: 820 mg/dL (ref 603–1613)
IgM (Immunoglobulin M), Srm: 45 mg/dL (ref 15–143)
Total Protein ELP: 6.6 g/dL (ref 6.0–8.5)

## 2019-12-29 ENCOUNTER — Encounter: Payer: Self-pay | Admitting: Oncology

## 2019-12-29 ENCOUNTER — Inpatient Hospital Stay: Payer: Medicare HMO | Admitting: Oncology

## 2019-12-29 ENCOUNTER — Inpatient Hospital Stay (HOSPITAL_BASED_OUTPATIENT_CLINIC_OR_DEPARTMENT_OTHER): Payer: Medicare HMO | Admitting: Oncology

## 2019-12-29 VITALS — BP 124/64 | HR 54 | Temp 95.8°F | Resp 16 | Wt 188.0 lb

## 2019-12-29 DIAGNOSIS — N183 Chronic kidney disease, stage 3 unspecified: Secondary | ICD-10-CM | POA: Diagnosis not present

## 2019-12-29 DIAGNOSIS — D472 Monoclonal gammopathy: Secondary | ICD-10-CM | POA: Diagnosis not present

## 2019-12-29 DIAGNOSIS — E785 Hyperlipidemia, unspecified: Secondary | ICD-10-CM | POA: Diagnosis not present

## 2019-12-29 DIAGNOSIS — Z79899 Other long term (current) drug therapy: Secondary | ICD-10-CM | POA: Diagnosis not present

## 2019-12-29 DIAGNOSIS — I129 Hypertensive chronic kidney disease with stage 1 through stage 4 chronic kidney disease, or unspecified chronic kidney disease: Secondary | ICD-10-CM | POA: Diagnosis not present

## 2019-12-29 NOTE — Progress Notes (Signed)
Pt doing good no concerns.

## 2020-01-01 NOTE — Progress Notes (Signed)
Hematology/Oncology Consult note [facility]  Telephone:([phone] [phone]  Patient Care Team: Marinda Elk, MD as PCP - General (Physician Assistant)   Name of the patient: Bruce Garza  [id number]  Mar 06, 1946   Date of visit: 01/01/20  Diagnosis- light chain MGUS  Chief complaint/ Reason for visit- routine f/u of MGUS  Heme/Onc history: patient is a 74 year old male with a past medical history significant for hypertension hyperlipidemia and stage III chronic kidney disease for which he sees Dr. Holley Raring. He has been referred to [place] for elevated free light chains.Most recent 6 CMP from 04/27/2019 showed creatinine of 1.4 and BUN of 25. Calcium was 9.7. Electrolytes and LFTs were normal. CBC showed a white count of 8.7, H&H of 15.9/45.9 and a platelet count of 157.  Results of blood work from 05/26/2019 were as follows: CBC was normal with an H&H of 15.4/45.4. CMP was significant for serum creatinine of 1.5. Calcium normal at 9.3. 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 Free light chain showed elevated kappa of 61 with a free light chain ratio of 3.1. 24-hour urine protein electrophoresis did not reveal any evidence of M protein.  Patient underwent bone marrow biopsy given the elevated free light chain ratio which showed 3% plasma cells but was not consistent with multiple myeloma and consistent with light chain MGUS   Interval history- patient is doing well. He denies any complaints at this time. Denies any back pain. Appetite and weight are stable  ECOG PS- 1 Pain scale- 0   Review of systems- Review of Systems  Constitutional: Negative for chills, fever, malaise/fatigue and weight loss.  HENT: Negative for congestion, ear discharge and nosebleeds.   Eyes: Negative for blurred vision.  Respiratory: Negative for cough, hemoptysis, sputum production,  shortness of breath and wheezing.   Cardiovascular: Negative for chest pain, palpitations, orthopnea and claudication.  Gastrointestinal: Negative for abdominal pain, blood in stool, constipation, diarrhea, heartburn, melena, nausea and vomiting.  Genitourinary: Negative for dysuria, flank pain, frequency, hematuria and urgency.  Musculoskeletal: Negative for back pain, joint pain and myalgias.  Skin: Negative for rash.  Neurological: Negative for dizziness, tingling, focal weakness, seizures, weakness and headaches.  Endo/Heme/Allergies: Does not bruise/bleed easily.  Psychiatric/Behavioral: Negative for depression and suicidal ideas. The patient does not have insomnia.        Allergies  Allergen Reactions  . Lisinopril Swelling    Facial     Past Medical History:  Diagnosis Date  . Elevated serum immunoglobulin free light chains   . History of shingles   . Hyperlipidemia   . Hypertension   . MGUS (monoclonal gammopathy of unknown significance)      Past Surgical History:  Procedure Laterality Date  . COLONOSCOPY WITH PROPOFOL N/A 07/22/2015   Procedure: COLONOSCOPY WITH PROPOFOL;  Surgeon: Lollie Sails, MD;  Location: [facility] ENDOSCOPY;  Service: Endoscopy;  Laterality: N/A;  . excision ganglion cyst wrist      Social History   Socioeconomic History  . Marital status: Married    Spouse name: Bruce Garza  . Number of children: Not on file  . Years of education: Not on file  . Highest education level: Not on file  Occupational History  . Not on file  Tobacco Use  . Smoking status: Never Smoker  . Smokeless tobacco: Never Used  Substance and Sexual Activity  . Alcohol use: Never  . Drug use: Never  .  Sexual activity: Not on file  Other Topics Concern  . Not on file  Social History Narrative  . Not on file   Social Determinants of Health   Financial Resource Strain:   . Difficulty of Paying Living Expenses:   Food Insecurity:   . Worried About Sales executive in the Last Year:   . Arboriculturist in the Last Year:   Transportation Needs:   . Film/video editor (Medical):   Marland Kitchen Lack of Transportation (Non-Medical):   Physical Activity:   . Days of Exercise per Week:   . Minutes of Exercise per Session:   Stress:   . Feeling of Stress :   Social Connections:   . Frequency of Communication with Friends and Family:   . Frequency of Social Gatherings with Friends and Family:   . Attends Religious Services:   . Active Member of Clubs or Organizations:   . Attends Archivist Meetings:   Marland Kitchen Marital Status:   Intimate Partner Violence:   . Fear of Current or Ex-Partner:   . Emotionally Abused:   Marland Kitchen Physically Abused:   . Sexually Abused:     History reviewed. No pertinent family history.   Current Outpatient Medications:  .  amLODipine (NORVASC) 10 MG tablet, Take 10 mg by mouth daily., Disp: , Rfl:  .  ascorbic acid (VITAMIN C) 1000 MG tablet, Take 1,000 mg by mouth daily., Disp: , Rfl:  .  Cholecalciferol (VITAMIN D3) 25 MCG (1000 UT) CAPS, Take by mouth., Disp: , Rfl:  .  dorzolamide-timolol (COSOPT) 22.3-6.8 MG/ML ophthalmic solution, 1 drop 2 (two) times daily., Disp: , Rfl:  .  losartan (COZAAR) 25 MG tablet, Take 25 mg by mouth daily. Every evening, Disp: , Rfl:  .  Multiple Vitamins-Minerals (OCUVITE ADULT 50+ PO), Take 1 capsule by mouth in the morning and at bedtime. , Disp: , Rfl:  .  pravastatin (PRAVACHOL) 10 MG tablet, Take 10 mg by mouth daily., Disp: , Rfl:  .  prednisoLONE acetate (PRED FORTE) 1 % ophthalmic suspension, 1 drop in the morning and at bedtime. , Disp: , Rfl:  .  spironolactone (ALDACTONE) 25 MG tablet, Take 25 mg by mouth 2 (two) times daily., Disp: , Rfl:   Physical exam:  Vitals:   12/29/19 1019 12/29/19 1023  BP:  124/64  Pulse:  (!) 54  Resp:  16  Temp: (!) 95.8 F (35.4 C) (!) 95.8 F (35.4 C)  TempSrc: Tympanic Tympanic  Weight: 188 lb (85.3 kg) 188 lb (85.3 kg)   Physical  Exam Cardiovascular:     Rate and Rhythm: Normal rate and regular rhythm.     Heart sounds: Normal heart sounds.  Pulmonary:     Effort: Pulmonary effort is normal.     Breath sounds: Normal breath sounds.  Abdominal:     General: Bowel sounds are normal.     Palpations: Abdomen is soft.  Skin:    General: Skin is warm and dry.  Neurological:     Mental Status: He is alert and oriented to person, place, and time.      CMP Latest Ref Rng & Units 12/23/2019  Glucose 70 - 99 mg/dL 137(H)  BUN 8 - 23 mg/dL 18  Creatinine 0.61 - 1.24 mg/dL 1.46(H)  Sodium 135 - 145 mmol/L 140  Potassium 3.5 - 5.1 mmol/L 4.1  Chloride 98 - 111 mmol/L 105  CO2 22 - 32 mmol/L 25  Calcium 8.9 -  10.3 mg/dL 8.9  Total Protein 6.5 - 8.1 g/dL 7.0  Total Bilirubin 0.3 - 1.2 mg/dL 1.0  Alkaline Phos 38 - 126 U/L 60  AST 15 - 41 U/L 26  ALT 0 - 44 U/L 25   CBC Latest Ref Rng & Units 12/23/2019  WBC 4.0 - 10.5 K/uL 7.6  Hemoglobin 13.0 - 17.0 g/dL 13.0  Hematocrit 39.0 - 52.0 % 39.8  Platelets 150 - 400 K/uL 191      Assessment and plan- Patient is a 74 y.o. male with light chain MGUS here for routine f/u  Cbc is normal. He has stable CKD with creatinine of 1.4. no hypercalcemia. Myeloma panel shows no M protein on spep but a small protein on IFE IgA kappa. Free light chain ratio stable around 2. No concern for progression to overt myeloma at this time. This can be monitored conservatively.   Repeat cbc, cmp, myeloma panel and free light chain in 6 months and 1 year and I will see him back in 1 year   Visit Diagnosis 1. MGUS (monoclonal gammopathy of unknown significance)      Dr. Randa Evens, MD, MPH [facility] at [facility] [phone] 01/01/2020 7:46 AM

## 2020-01-12 DIAGNOSIS — Z9189 Other specified personal risk factors, not elsewhere classified: Secondary | ICD-10-CM | POA: Diagnosis not present

## 2020-01-12 DIAGNOSIS — R7303 Prediabetes: Secondary | ICD-10-CM | POA: Diagnosis not present

## 2020-01-12 DIAGNOSIS — E782 Mixed hyperlipidemia: Secondary | ICD-10-CM | POA: Diagnosis not present

## 2020-01-12 DIAGNOSIS — N1831 Chronic kidney disease, stage 3a: Secondary | ICD-10-CM | POA: Diagnosis not present

## 2020-01-12 DIAGNOSIS — I10 Essential (primary) hypertension: Secondary | ICD-10-CM | POA: Diagnosis not present

## 2020-01-12 DIAGNOSIS — Z125 Encounter for screening for malignant neoplasm of prostate: Secondary | ICD-10-CM | POA: Diagnosis not present

## 2020-01-19 DIAGNOSIS — G4733 Obstructive sleep apnea (adult) (pediatric): Secondary | ICD-10-CM | POA: Diagnosis not present

## 2020-01-19 DIAGNOSIS — Z0000 Encounter for general adult medical examination without abnormal findings: Secondary | ICD-10-CM | POA: Diagnosis not present

## 2020-01-19 DIAGNOSIS — D472 Monoclonal gammopathy: Secondary | ICD-10-CM | POA: Diagnosis not present

## 2020-01-19 DIAGNOSIS — E782 Mixed hyperlipidemia: Secondary | ICD-10-CM | POA: Diagnosis not present

## 2020-01-19 DIAGNOSIS — H6123 Impacted cerumen, bilateral: Secondary | ICD-10-CM | POA: Diagnosis not present

## 2020-01-19 DIAGNOSIS — R7303 Prediabetes: Secondary | ICD-10-CM | POA: Diagnosis not present

## 2020-01-19 DIAGNOSIS — I129 Hypertensive chronic kidney disease with stage 1 through stage 4 chronic kidney disease, or unspecified chronic kidney disease: Secondary | ICD-10-CM | POA: Diagnosis not present

## 2020-01-19 DIAGNOSIS — D649 Anemia, unspecified: Secondary | ICD-10-CM | POA: Diagnosis not present

## 2020-01-19 DIAGNOSIS — N1831 Chronic kidney disease, stage 3a: Secondary | ICD-10-CM | POA: Diagnosis not present

## 2020-01-25 DIAGNOSIS — H6123 Impacted cerumen, bilateral: Secondary | ICD-10-CM | POA: Diagnosis not present

## 2020-01-25 DIAGNOSIS — H93299 Other abnormal auditory perceptions, unspecified ear: Secondary | ICD-10-CM | POA: Diagnosis not present

## 2020-02-08 DIAGNOSIS — I10 Essential (primary) hypertension: Secondary | ICD-10-CM | POA: Diagnosis not present

## 2020-02-08 DIAGNOSIS — D472 Monoclonal gammopathy: Secondary | ICD-10-CM | POA: Diagnosis not present

## 2020-02-08 DIAGNOSIS — N1832 Chronic kidney disease, stage 3b: Secondary | ICD-10-CM | POA: Diagnosis not present

## 2020-02-24 DIAGNOSIS — G4733 Obstructive sleep apnea (adult) (pediatric): Secondary | ICD-10-CM | POA: Diagnosis not present

## 2020-03-21 DIAGNOSIS — H401133 Primary open-angle glaucoma, bilateral, severe stage: Secondary | ICD-10-CM | POA: Diagnosis not present

## 2020-03-28 DIAGNOSIS — H401133 Primary open-angle glaucoma, bilateral, severe stage: Secondary | ICD-10-CM | POA: Diagnosis not present

## 2020-06-13 DIAGNOSIS — I10 Essential (primary) hypertension: Secondary | ICD-10-CM | POA: Diagnosis not present

## 2020-06-13 DIAGNOSIS — D472 Monoclonal gammopathy: Secondary | ICD-10-CM | POA: Diagnosis not present

## 2020-06-13 DIAGNOSIS — N1832 Chronic kidney disease, stage 3b: Secondary | ICD-10-CM | POA: Diagnosis not present

## 2020-06-30 ENCOUNTER — Inpatient Hospital Stay: Payer: Medicare HMO | Attending: Oncology

## 2020-06-30 ENCOUNTER — Other Ambulatory Visit: Payer: Self-pay

## 2020-06-30 DIAGNOSIS — I129 Hypertensive chronic kidney disease with stage 1 through stage 4 chronic kidney disease, or unspecified chronic kidney disease: Secondary | ICD-10-CM | POA: Diagnosis not present

## 2020-06-30 DIAGNOSIS — N183 Chronic kidney disease, stage 3 unspecified: Secondary | ICD-10-CM | POA: Insufficient documentation

## 2020-06-30 DIAGNOSIS — D472 Monoclonal gammopathy: Secondary | ICD-10-CM | POA: Insufficient documentation

## 2020-06-30 DIAGNOSIS — Z79899 Other long term (current) drug therapy: Secondary | ICD-10-CM | POA: Diagnosis not present

## 2020-06-30 DIAGNOSIS — E785 Hyperlipidemia, unspecified: Secondary | ICD-10-CM | POA: Diagnosis not present

## 2020-06-30 LAB — CBC WITH DIFFERENTIAL/PLATELET
Abs Immature Granulocytes: 0.04 10*3/uL (ref 0.00–0.07)
Basophils Absolute: 0 10*3/uL (ref 0.0–0.1)
Basophils Relative: 0 %
Eosinophils Absolute: 0.4 10*3/uL (ref 0.0–0.5)
Eosinophils Relative: 5 %
HCT: 39.6 % (ref 39.0–52.0)
Hemoglobin: 14.1 g/dL (ref 13.0–17.0)
Immature Granulocytes: 1 %
Lymphocytes Relative: 23 %
Lymphs Abs: 2 10*3/uL (ref 0.7–4.0)
MCH: 33.8 pg (ref 26.0–34.0)
MCHC: 35.6 g/dL (ref 30.0–36.0)
MCV: 95 fL (ref 80.0–100.0)
Monocytes Absolute: 0.5 10*3/uL (ref 0.1–1.0)
Monocytes Relative: 6 %
Neutro Abs: 5.8 10*3/uL (ref 1.7–7.7)
Neutrophils Relative %: 65 %
Platelets: 183 10*3/uL (ref 150–400)
RBC: 4.17 MIL/uL — ABNORMAL LOW (ref 4.22–5.81)
RDW: 12.2 % (ref 11.5–15.5)
WBC: 8.9 10*3/uL (ref 4.0–10.5)
nRBC: 0 % (ref 0.0–0.2)

## 2020-06-30 LAB — COMPREHENSIVE METABOLIC PANEL
ALT: 28 U/L (ref 0–44)
AST: 24 U/L (ref 15–41)
Albumin: 4.4 g/dL (ref 3.5–5.0)
Alkaline Phosphatase: 54 U/L (ref 38–126)
Anion gap: 9 (ref 5–15)
BUN: 24 mg/dL — ABNORMAL HIGH (ref 8–23)
CO2: 24 mmol/L (ref 22–32)
Calcium: 8.9 mg/dL (ref 8.9–10.3)
Chloride: 105 mmol/L (ref 98–111)
Creatinine, Ser: 1.53 mg/dL — ABNORMAL HIGH (ref 0.61–1.24)
GFR calc Af Amer: 51 mL/min — ABNORMAL LOW (ref >60)
GFR calc non Af Amer: 44 mL/min — ABNORMAL LOW (ref >60)
Glucose, Bld: 142 mg/dL — ABNORMAL HIGH (ref 70–99)
Potassium: 4.4 mmol/L (ref 3.5–5.1)
Sodium: 138 mmol/L (ref 135–145)
Total Bilirubin: 1.2 mg/dL (ref 0.3–1.2)
Total Protein: 7.2 g/dL (ref 6.5–8.1)

## 2020-07-01 LAB — KAPPA/LAMBDA LIGHT CHAINS
Kappa free light chain: 59 mg/L — ABNORMAL HIGH (ref 3.3–19.4)
Kappa, lambda light chain ratio: 3.6 — ABNORMAL HIGH (ref 0.26–1.65)
Lambda free light chains: 16.4 mg/L (ref 5.7–26.3)

## 2020-07-04 ENCOUNTER — Ambulatory Visit: Payer: Medicare HMO | Admitting: Dermatology

## 2020-07-04 ENCOUNTER — Other Ambulatory Visit: Payer: Self-pay

## 2020-07-04 DIAGNOSIS — L578 Other skin changes due to chronic exposure to nonionizing radiation: Secondary | ICD-10-CM | POA: Diagnosis not present

## 2020-07-04 DIAGNOSIS — D485 Neoplasm of uncertain behavior of skin: Secondary | ICD-10-CM

## 2020-07-04 DIAGNOSIS — L820 Inflamed seborrheic keratosis: Secondary | ICD-10-CM

## 2020-07-04 DIAGNOSIS — C4491 Basal cell carcinoma of skin, unspecified: Secondary | ICD-10-CM

## 2020-07-04 DIAGNOSIS — C44311 Basal cell carcinoma of skin of nose: Secondary | ICD-10-CM | POA: Diagnosis not present

## 2020-07-04 DIAGNOSIS — L570 Actinic keratosis: Secondary | ICD-10-CM | POA: Diagnosis not present

## 2020-07-04 HISTORY — DX: Basal cell carcinoma of skin, unspecified: C44.91

## 2020-07-04 LAB — MULTIPLE MYELOMA PANEL, SERUM
Albumin SerPl Elph-Mcnc: 4 g/dL (ref 2.9–4.4)
Albumin/Glob SerPl: 1.5 (ref 0.7–1.7)
Alpha 1: 0.2 g/dL (ref 0.0–0.4)
Alpha2 Glob SerPl Elph-Mcnc: 0.6 g/dL (ref 0.4–1.0)
B-Globulin SerPl Elph-Mcnc: 1.1 g/dL (ref 0.7–1.3)
Gamma Glob SerPl Elph-Mcnc: 0.8 g/dL (ref 0.4–1.8)
Globulin, Total: 2.7 g/dL (ref 2.2–3.9)
IgA: 352 mg/dL (ref 61–437)
IgG (Immunoglobin G), Serum: 782 mg/dL (ref 603–1613)
IgM (Immunoglobulin M), Srm: 42 mg/dL (ref 15–143)
Total Protein ELP: 6.7 g/dL (ref 6.0–8.5)

## 2020-07-04 NOTE — Progress Notes (Signed)
New Patient Visit  Subjective  Bruce Garza is a 74 y.o. male who presents for the following: Lesions (Patient has a few spots at face to be checked. Present for a few years. ). The spot under right eye does itch. No history of skin cancer.  Spot at right first finger present for 5-6 years, history of wart treated with LN2 years ago.   The following portions of the chart were reviewed this encounter and updated as appropriate:  Tobacco  Allergies  Meds  Problems  Med Hx  Surg Hx  Fam Hx     Review of Systems:  No other skin or systemic complaints except as noted in HPI or Assessment and Plan.  Objective  Well appearing patient in no apparent distress; mood and affect are within normal limits.  A focused examination was performed including face, right hand. Relevant physical exam findings are noted in the Assessment and Plan.  Objective  Left Nasal Bridge: 0.6cm pearly papule R/o BCC     Objective  Right Volar Index Finger: 1.0cm verrucous papule with underlying swelling Wart r/o SCC     Objective  Right Lower Eyelid : Erythematous keratotic or waxy stuck-on papule or plaque.    Assessment & Plan  Neoplasm of uncertain behavior of skin (2) Left Nasal Bridge  Skin / nail biopsy Type of biopsy: tangential   Informed consent: discussed and consent obtained   Timeout: patient name, date of birth, surgical site, and procedure verified   Procedure prep:  Patient was prepped and draped in usual sterile fashion Prep type:  Isopropyl alcohol Anesthesia: the lesion was anesthetized in a standard fashion   Anesthetic:  1% lidocaine w/ epinephrine 1-100,000 buffered w/ 8.4% NaHCO3 Instrument used: flexible razor blade   Hemostasis achieved with: pressure, aluminum chloride and electrodesiccation   Outcome: patient tolerated procedure well   Post-procedure details: sterile dressing applied and wound care instructions given   Dressing type: petrolatum and bandage      Specimen 1 - Surgical pathology Differential Diagnosis:  Check Margins: No 0.6cm pearly papule R/o BCC  Right Volar Index Finger  Skin / nail biopsy Type of biopsy: tangential   Informed consent: discussed and consent obtained   Timeout: patient name, date of birth, surgical site, and procedure verified   Procedure prep:  Patient was prepped and draped in usual sterile fashion Prep type:  Isopropyl alcohol Anesthesia: the lesion was anesthetized in a standard fashion   Anesthetic:  1% lidocaine w/ epinephrine 1-100,000 buffered w/ 8.4% NaHCO3 Instrument used: flexible razor blade   Hemostasis achieved with: pressure, aluminum chloride and electrodesiccation   Outcome: patient tolerated procedure well   Post-procedure details: sterile dressing applied and wound care instructions given   Dressing type: petrolatum and bandage    Specimen 2 - Surgical pathology Differential Diagnosis:  Check Margins: No 1.0cm verrucous papule with underlying swelling Wart r/o SCC  Swelling may be unrelated to wart and may be underlying growth at right volar index finger, may recommend hand surgeon evaluate swelling  Inflamed seborrheic keratosis Right Lower Eyelid   Destruction of lesion - Right Lower Eyelid   Destruction method comment:  Destruction by shave removal Informed consent: discussed and consent obtained   Timeout:  patient name, date of birth, surgical site, and procedure verified Anesthesia: the lesion was anesthetized in a standard fashion   Anesthetic:  1% lidocaine w/ epinephrine 1-100,000 local infiltration Lesion length (cm):  0.5 Lesion width (cm):  0.5 Hemostasis achieved  with:  pressure, aluminum chloride and electrodesiccation Outcome: patient tolerated procedure well with no complications   Post-procedure details: wound care instructions given    Actinic Damage - diffuse scaly erythematous macules with underlying dyspigmentation - Recommend daily broad spectrum  sunscreen SPF 30+ to sun-exposed areas, reapply every 2 hours as needed.  - Call for new or changing lesions.  Return if symptoms worsen or fail to improve.  Graciella Belton, RMA, am acting as scribe for Sarina Ser, MD . Documentation: I have reviewed the above documentation for accuracy and completeness, and I agree with the above.  Sarina Ser, MD

## 2020-07-04 NOTE — Patient Instructions (Addendum)

## 2020-07-05 ENCOUNTER — Encounter: Payer: Self-pay | Admitting: Dermatology

## 2020-07-11 ENCOUNTER — Telehealth: Payer: Self-pay

## 2020-07-11 NOTE — Telephone Encounter (Signed)
-----   Message from Ralene Bathe, MD sent at 07/08/2020  3:42 PM EDT ----- 1. Skin , left nasal bridge BASAL CELL CARCINOMA, MICRONODULAR PATTERN 2. Skin , right volar index finger VERRUCOID KERATOSIS, BASE INVOLVED  1- Cancer - BCC Schedule surgery 2- Benign thickened skin  = viral wart vs Prurigo nodule Will discuss treatment options next visit

## 2020-07-11 NOTE — Telephone Encounter (Signed)
Discussed biopsy results with pt, pt will schedule surgery appt

## 2020-07-19 DIAGNOSIS — R7303 Prediabetes: Secondary | ICD-10-CM | POA: Diagnosis not present

## 2020-07-19 DIAGNOSIS — E782 Mixed hyperlipidemia: Secondary | ICD-10-CM | POA: Diagnosis not present

## 2020-07-19 DIAGNOSIS — I10 Essential (primary) hypertension: Secondary | ICD-10-CM | POA: Diagnosis not present

## 2020-07-26 IMAGING — CT CT BIOPSY AND ASPIRATION BONE MARROW
1 of 6 series · 4 of 16 positions shown, 5 images · non-contrast
Comparison: none

INDICATION: Elevated light chains.  Evaluation for multiple myeloma.

[Series 2: i-spiral 5.0 b30f · axial · 0.71mm/px · z∈[-178,-101]mm · 4 of 38 slices shown, 5 images]
[im 8/38  soft-tissue]
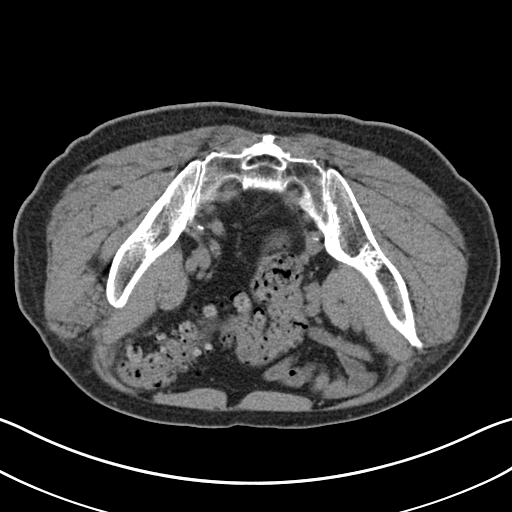
[im 8/38  bone]
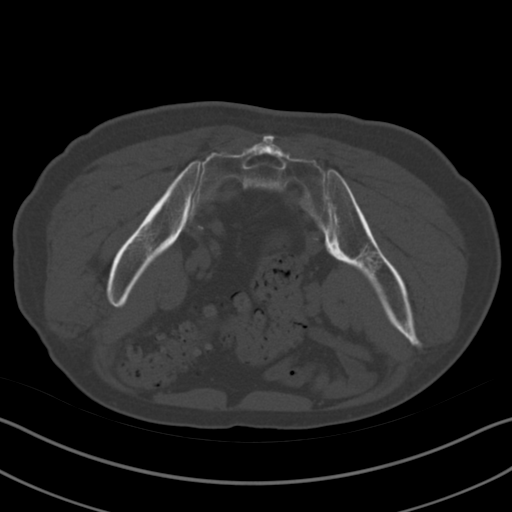
[im 15/38  soft-tissue]
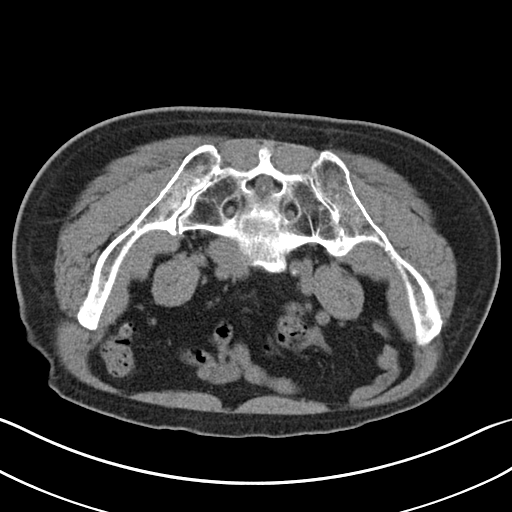
[im 23/38  soft-tissue]
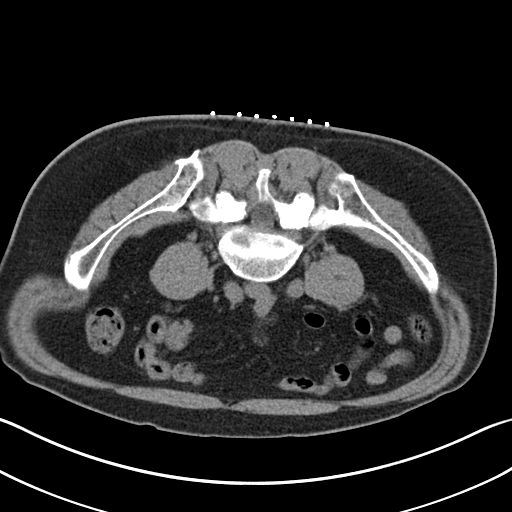
[im 30/38  soft-tissue]
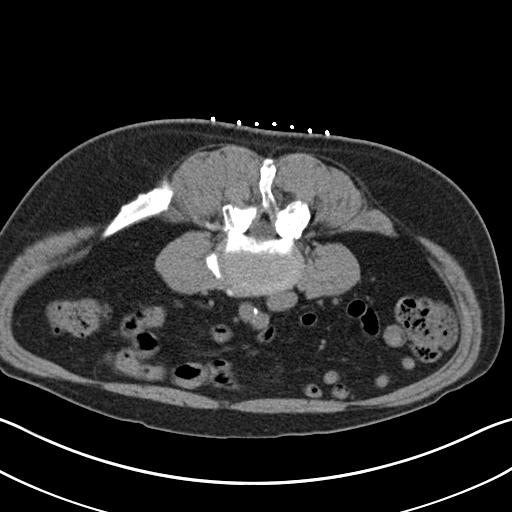

[4 of 16 positions shown; findings below may reference images not displayed]

EXAM:
CT-DIRECTED BONE MARROW BIOPSY

MEDICATIONS:
None.

ANESTHESIA/SEDATION:
Moderate (conscious) sedation was employed during this procedure. A
total of Versed 2.0 mg and Fentanyl 50.0 mcg was administered
intravenously.

Moderate Sedation Time: Approximately 20 minutes. The patient's
level of consciousness and vital signs were monitored continuously
by radiology nursing throughout the procedure under my direct
supervision.

FLUOROSCOPY TIME:  Fluoroscopy Time: 0 minutes 0 seconds (0 mGy).

COMPLICATIONS:
None immediate.

PROCEDURE:
After discussing the risks and benefits of this procedure with the
patient informed consent was obtained. Back was sterilely prepped
and draped. Following local anesthesia with 1% lidocaine and IV
conscious sedation CT-directed bone marrow aspiration and biopsy was
performed using the on site biopsy system. Two bone marrow aspirates
and core biopsies obtained. No complications. Hemostasis achieved
following needle removal. Patient sent to recovery room in good
condition.
IMPRESSION: Successful CT-directed bone marrow aspiration and biopsy.

## 2020-07-29 DIAGNOSIS — H401133 Primary open-angle glaucoma, bilateral, severe stage: Secondary | ICD-10-CM | POA: Diagnosis not present

## 2020-09-06 ENCOUNTER — Other Ambulatory Visit: Payer: Self-pay

## 2020-09-06 ENCOUNTER — Ambulatory Visit: Payer: Medicare HMO | Admitting: Dermatology

## 2020-09-06 ENCOUNTER — Telehealth: Payer: Self-pay

## 2020-09-06 DIAGNOSIS — B078 Other viral warts: Secondary | ICD-10-CM | POA: Diagnosis not present

## 2020-09-06 DIAGNOSIS — C44311 Basal cell carcinoma of skin of nose: Secondary | ICD-10-CM

## 2020-09-06 MED ORDER — MUPIROCIN 2 % EX OINT: 1.0000 "application " | TOPICAL_OINTMENT | Freq: Every day | CUTANEOUS | 0 refills | Status: DC

## 2020-09-06 NOTE — Patient Instructions (Signed)

## 2020-09-06 NOTE — Progress Notes (Signed)
   Follow-Up Visit   Subjective  Bruce Garza is a 74 y.o. male who presents for the following: Basal Cell Carcinoma (Biopsy proven - left nasal bridge - excise today).  The following portions of the chart were reviewed this encounter and updated as appropriate:  Tobacco  Allergies  Meds  Problems  Med Hx  Surg Hx  Fam Hx     Review of Systems:  No other skin or systemic complaints except as noted in HPI or Assessment and Plan.  Objective  Well appearing patient in no apparent distress; mood and affect are within normal limits.  A focused examination was performed including face. Relevant physical exam findings are noted in the Assessment and Plan.  Objective  Right volar index finger: Healing biopsy site  Objective  Left nasl bridge and left medial canthus/lower eyelid: Healing biopsy site - 1.2 x 1.0 cm   Assessment & Plan  Other viral warts Right volar index finger  Biopsy proven Viral wart - Discussed risk of recurrence. May consider [place] candida injections on follow up.  Basal cell carcinoma (BCC) of skin of nose Left nasl bridge and left medial canthus/lower eyelid  Skin excision  Lesion length (cm):  1.2 Lesion width (cm):  1 Margin per side (cm):  0.1 Total excision diameter (cm):  1.4 Informed consent: discussed and consent obtained   Timeout: patient name, date of birth, surgical site, and procedure verified   Procedure prep:  Patient was prepped and draped in usual sterile fashion Prep type:  Isopropyl alcohol and povidone-iodine Anesthesia: the lesion was anesthetized in a standard fashion   Anesthetic:  1% lidocaine w/ epinephrine 1-100,000 buffered w/ 8.4% NaHCO3 Instrument used comment:  15 C Hemostasis achieved with: pressure   Hemostasis achieved with comment:  Electrocautery Outcome: patient tolerated procedure well with no complications   Post-procedure details: sterile dressing applied and wound care instructions given   Dressing type:  bandage and pressure dressing (mupirocin)   Additional details:  Tag at proximal 12:00 superior edge  Skin repair Complexity:  Complex Final length (cm):  2.2 Reason for type of repair: reduce tension to allow closure, reduce the risk of dehiscence, infection, and necrosis, reduce subcutaneous dead space and avoid a hematoma, allow closure of the large defect, preserve normal anatomy, preserve normal anatomical and functional relationships and enhance both functionality and cosmetic results   Undermining: area extensively undermined   Undermining comment:  Undermining defect  Subcutaneous layers (deep stitches):  Suture size:  5-0 Suture type: Vicryl (polyglactin 910)   Subcutaneous suture technique: inverted dermal. Fine/surface layer approximation (top stitches):  Suture size:  5-0 Suture type: nylon   Stitches: horizontal mattress   Suture removal (days):  7 Hemostasis achieved with: suture and pressure Outcome: patient tolerated procedure well with no complications   Post-procedure details: sterile dressing applied and wound care instructions given   Dressing type: bandage and pressure dressing (mupirocin)    mupirocin ointment (BACTROBAN) 2 %  Specimen 1 - Surgical pathology Differential Diagnosis: Biopsy proven BCC Check Margins: Yes Healing biopsy site - 1.2 x 1.0 cm [id number]  Mupirocin ointment qd with dressing changes  Return in about 1 week (around 09/13/2020) for suture removal.  I, Ashok Cordia, CMA, am acting as scribe for Sarina Ser, MD .  Documentation: I have reviewed the above documentation for accuracy and completeness, and I agree with the above.  Sarina Ser, MD

## 2020-09-06 NOTE — Telephone Encounter (Signed)
Spoke to patients wife and she said pt doing fine after today's surgery.Bruce Garza

## 2020-09-09 DIAGNOSIS — G4733 Obstructive sleep apnea (adult) (pediatric): Secondary | ICD-10-CM | POA: Diagnosis not present

## 2020-09-13 ENCOUNTER — Other Ambulatory Visit: Payer: Self-pay

## 2020-09-13 ENCOUNTER — Ambulatory Visit (INDEPENDENT_AMBULATORY_CARE_PROVIDER_SITE_OTHER): Payer: Medicare HMO | Admitting: Dermatology

## 2020-09-13 DIAGNOSIS — Z4802 Encounter for removal of sutures: Secondary | ICD-10-CM

## 2020-09-13 DIAGNOSIS — B079 Viral wart, unspecified: Secondary | ICD-10-CM | POA: Diagnosis not present

## 2020-09-13 DIAGNOSIS — C44311 Basal cell carcinoma of skin of nose: Secondary | ICD-10-CM

## 2020-09-13 NOTE — Patient Instructions (Signed)
CANDIDA ALBICANS IMMUNOTHERAPY OF WARTS     INSTRUCTIONS   Candida immunotherapy injections for warts are usually quite safe and tolerated well by Most people. However, mild to moderate itching, tenderness, or swelling at the injection Site is common and usually last 24 to 48 hours. If you are uncomfortable, you may:   1. Elevate the painful area  2. Apply ice wrapped in a towel for five minutes on and five minutes off  3. Take Tylenol every 4 to 6 hours (NOT ibuprofen, aspirin, Advil, Aleve       Or Motrin due the anti-inflammatory properties, since the goal is to        Generate an inflammatory reaction to kill the wart virus)  Only 5% of Candida immunotherapy patients get flu-like symptoms (such as muscle And joint aches, chills or headaches). Rarely, fever can also occur. Usually these sympotms Last only 24 to 48 hours. If anny of these symptoms occur, taking Tylenol every four to six  Hours really helps. This can be prevented in subsequent office visits by taking Tylenol BEFORE your next injection (Remember, DO NOT take ibuprofen, aspirin, Advil, Aleve, or Mortin)  I have not seen widespread hives (itchy welts) occur from this treatment. But if this Occurs, take oral Benadryl every four to six hours.  Our phone number is [phone]. If you have any serious or persistent problems, Please try to contact our office for further directions. 

## 2020-09-13 NOTE — Progress Notes (Signed)
   Follow-Up Visit   Subjective  Bruce Garza is a 74 y.o. male who presents for the following: BCC margins free, bx proven (left nasal bridge and left medial canthus/lower eyelid, pt presents for suture removal) and Warts (R volar index finger, bx proven, pt presents for Candida injections).  The following portions of the chart were reviewed this encounter and updated as appropriate:  Tobacco  Allergies  Meds  Problems  Med Hx  Surg Hx  Fam Hx     Review of Systems:  No other skin or systemic complaints except as noted in HPI or Assessment and Plan.  Objective  Well appearing patient in no apparent distress; mood and affect are within normal limits.  A focused examination was performed including face, R index finger. Relevant physical exam findings are noted in the Assessment and Plan.  Objective  left nasal bridge and left medial canthus/lower eyelid: Healing excision site  Objective  Right volar index finger: Verrucous papules -- Discussed viral etiology and contagion.    Assessment & Plan  Basal cell carcinoma (BCC) of skin of nose left nasal bridge and left medial canthus/lower eyelid  Margins free, bx proven  Healing excision site  Wound cleansed, sutures removed, wound cleansed and steri strips applied. Discussed pathology results.   Other Related Medications mupirocin ointment (BACTROBAN) 2 %  Viral warts, unspecified type Right volar index finger  Bx proven 07/04/20  Candida Injection today x 1 Lot #[id number] exp 09/2021  Intralesional injection - Right volar index finger Location: R volar index finger  Informed Consent: Discussed risks (infection, pain, bleeding, bruising, thinning of the skin, loss of skin pigment, lack of resolution, and recurrence of lesion) and benefits of the procedure, as well as the alternatives. Informed consent was obtained. Preparation: The area was prepared a standard fashion.  Anesthesia:none  Procedure Details: An  intralesional injection was performed with candida antigen. 0.15cc in total were injected.  Total number of injections: 1  Plan: The patient was instructed on post-op care. Recommend OTC analgesia as needed for pain.   Return in about 6 weeks (around 10/25/2020) for wart f/u.   I, Othelia Pulling, RMA, am acting as scribe for Sarina Ser, MD .  Documentation: I have reviewed the above documentation for accuracy and completeness, and I agree with the above.  Sarina Ser, MD

## 2020-09-14 ENCOUNTER — Encounter: Payer: Self-pay | Admitting: Dermatology

## 2020-09-15 ENCOUNTER — Encounter: Payer: Self-pay | Admitting: Dermatology

## 2020-09-15 ENCOUNTER — Ambulatory Visit (INDEPENDENT_AMBULATORY_CARE_PROVIDER_SITE_OTHER): Payer: Medicare HMO | Admitting: Dermatology

## 2020-09-15 ENCOUNTER — Other Ambulatory Visit: Payer: Self-pay

## 2020-09-15 DIAGNOSIS — Z4800 Encounter for change or removal of nonsurgical wound dressing: Secondary | ICD-10-CM

## 2020-09-15 NOTE — Progress Notes (Signed)
   Follow-Up Visit   Subjective  Bruce Garza is a 74 y.o. male who presents for the following: Follow-up (Pt c/o re opened surgery site started to bleed last night he would like checked today, pt was in the office 2 days ago he had steri strips applied).  Wife with pt contributing to history   The following portions of the chart were reviewed this encounter and updated as appropriate:  Tobacco  Allergies  Meds  Problems  Med Hx  Surg Hx  Fam Hx     Review of Systems:  No other skin or systemic complaints except as noted in HPI or Assessment and Plan.  Objective  Well appearing patient in no apparent distress; mood and affect are within normal limits.  A focused examination was performed including face. Relevant physical exam findings are noted in the Assessment and Plan.  Objective  L nasal bridge/L medial canthus, lower eyelid: Wound healing well    Assessment & Plan  Encounter for change or removal of surgical wound dressing L nasal bridge/L medial canthus, lower eyelid  Discussed with patient wound appears to be healing well   - Incision site at the L nasal bridge/L medial canthus lower eyelid  is clean, dry and intact - Wound cleansed,  wound cleansed and steri strips applied.   - Patient advised to keep steri-strips dry until they fall off. - Scars remodel for a full year. - Once steri-strips fall off, patient can apply over-the-counter silicone scar cream each night to help with scar remodeling if desired. - Patient advised to call with any concerns or if they notice any new or changing lesions.   Return if symptoms worsen or fail to improve.  IMarye Round, CMA, am acting as scribe for Sarina Ser, MD .  Documentation: I have reviewed the above documentation for accuracy and completeness, and I agree with the above.  Sarina Ser, MD

## 2020-09-21 ENCOUNTER — Encounter: Payer: Self-pay | Admitting: Dermatology

## 2020-09-23 DIAGNOSIS — H401113 Primary open-angle glaucoma, right eye, severe stage: Secondary | ICD-10-CM | POA: Diagnosis not present

## 2020-09-26 DIAGNOSIS — Z125 Encounter for screening for malignant neoplasm of prostate: Secondary | ICD-10-CM | POA: Diagnosis not present

## 2020-09-26 DIAGNOSIS — R7303 Prediabetes: Secondary | ICD-10-CM | POA: Diagnosis not present

## 2020-09-26 DIAGNOSIS — D472 Monoclonal gammopathy: Secondary | ICD-10-CM | POA: Diagnosis not present

## 2020-09-26 DIAGNOSIS — Z0000 Encounter for general adult medical examination without abnormal findings: Secondary | ICD-10-CM | POA: Diagnosis not present

## 2020-09-26 DIAGNOSIS — N1831 Chronic kidney disease, stage 3a: Secondary | ICD-10-CM | POA: Diagnosis not present

## 2020-09-26 DIAGNOSIS — E782 Mixed hyperlipidemia: Secondary | ICD-10-CM | POA: Diagnosis not present

## 2020-09-26 DIAGNOSIS — E876 Hypokalemia: Secondary | ICD-10-CM | POA: Diagnosis not present

## 2020-09-26 DIAGNOSIS — G4733 Obstructive sleep apnea (adult) (pediatric): Secondary | ICD-10-CM | POA: Diagnosis not present

## 2020-09-26 DIAGNOSIS — I129 Hypertensive chronic kidney disease with stage 1 through stage 4 chronic kidney disease, or unspecified chronic kidney disease: Secondary | ICD-10-CM | POA: Diagnosis not present

## 2020-10-03 DIAGNOSIS — I10 Essential (primary) hypertension: Secondary | ICD-10-CM | POA: Diagnosis not present

## 2020-10-03 DIAGNOSIS — D472 Monoclonal gammopathy: Secondary | ICD-10-CM | POA: Diagnosis not present

## 2020-10-03 DIAGNOSIS — N1832 Chronic kidney disease, stage 3b: Secondary | ICD-10-CM | POA: Diagnosis not present

## 2020-11-02 ENCOUNTER — Ambulatory Visit: Payer: Medicare HMO | Admitting: Dermatology

## 2020-11-02 ENCOUNTER — Other Ambulatory Visit: Payer: Self-pay

## 2020-11-02 DIAGNOSIS — Z85828 Personal history of other malignant neoplasm of skin: Secondary | ICD-10-CM | POA: Diagnosis not present

## 2020-11-02 DIAGNOSIS — B078 Other viral warts: Secondary | ICD-10-CM | POA: Diagnosis not present

## 2020-11-02 DIAGNOSIS — L578 Other skin changes due to chronic exposure to nonionizing radiation: Secondary | ICD-10-CM | POA: Diagnosis not present

## 2020-11-02 NOTE — Progress Notes (Signed)
   Follow-Up Visit   Subjective  Bruce Garza is a 75 y.o. male who presents for the following: Warts, Follow-up (Biopsy proven wart R volar index finger pt here for 2nd Candida injection today ), and Skin Cancer (Hx of BCC at Left nasal bridge at medial canthus lower eyelid ).  The following portions of the chart were reviewed this encounter and updated as appropriate:   Tobacco  Allergies  Meds  Problems  Med Hx  Surg Hx  Fam Hx     Review of Systems:  No other skin or systemic complaints except as noted in HPI or Assessment and Plan.  Objective  Well appearing patient in no apparent distress; mood and affect are within normal limits.  A focused examination was performed including face, R hand. Relevant physical exam findings are noted in the Assessment and Plan.  Objective  Right volar index finger: 1.5 cm x 1.5 cm Verrucous papules -- Discussed viral etiology and contagion.   Images      Objective  eft nasal bridge at medial canthus lower eyelid: Well healed scar with no evidence of recurrence.    Assessment & Plan  Other viral warts Right volar index finger  Discussed viral etiology and risk of spread.  Discussed multiple treatments may be required to clear warts.  Discussed possible post-treatment dyspigmentation and risk of recurrence.   Candida Antigen injection 2nd injection today   LN2 x 1  May consider shave removal in the future   Destruction of lesion - Right volar index finger Complexity: simple   Destruction method: cryotherapy   Informed consent: discussed and consent obtained   Timeout:  patient name, date of birth, surgical site, and procedure verified Lesion destroyed using liquid nitrogen: Yes   Region frozen until ice ball extended beyond lesion: Yes   Outcome: patient tolerated procedure well with no complications   Post-procedure details: wound care instructions given   Additional details:  Prior to procedure, discussed risks of  blister formation, small wound, skin dyspigmentation, or rare scar following cryotherapy.  Intralesional injection - Right volar index finger Location: R volar index finger   Informed Consent: Discussed risks (infection, pain, bleeding, bruising, thinning of the skin, loss of skin pigment, lack of resolution, and recurrence of lesion) and benefits of the procedure, as well as the alternatives. Informed consent was obtained. Preparation: The area was prepared a standard fashion.  Procedure Details: An intralesional injection was performed with candida antigen. 0.2 cc in total were injected.  Total number of injections: 1  Plan: The patient was instructed on post-op care. Recommend OTC analgesia as needed for pain.   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 Observe for recurrence. Call clinic for new or changing lesions.  Recommend regular skin exams, daily broad-spectrum spf 30+ sunscreen use, and photoprotection.     Actinic Damage - chronic, secondary to cumulative UV radiation exposure/sun exposure over time - diffuse scaly erythematous macules with underlying dyspigmentation - Recommend daily broad spectrum sunscreen SPF 30+ to sun-exposed areas, reapply every 2 hours as needed.  - Call for new or changing lesions.  Return in about 1 month (around 12/03/2020) for wart .  Bruce Garza, CMA, am acting as scribe for Sarina Ser, MD .  Documentation: I have reviewed the above documentation for accuracy and completeness, and I agree with the above.  Sarina Ser, MD

## 2020-11-02 NOTE — Patient Instructions (Signed)
Cryotherapy Aftercare  . Wash gently with soap and water everyday.   . Apply Vaseline and Band-Aid daily until healed.  

## 2020-11-06 ENCOUNTER — Encounter: Payer: Self-pay | Admitting: Dermatology

## 2020-11-13 DIAGNOSIS — Z03818 Encounter for observation for suspected exposure to other biological agents ruled out: Secondary | ICD-10-CM | POA: Diagnosis not present

## 2020-11-13 DIAGNOSIS — Z20822 Contact with and (suspected) exposure to covid-19: Secondary | ICD-10-CM | POA: Diagnosis not present

## 2020-12-02 DIAGNOSIS — H353132 Nonexudative age-related macular degeneration, bilateral, intermediate dry stage: Secondary | ICD-10-CM | POA: Diagnosis not present

## 2020-12-07 ENCOUNTER — Ambulatory Visit: Payer: Medicare HMO | Admitting: Dermatology

## 2020-12-07 ENCOUNTER — Other Ambulatory Visit: Payer: Self-pay

## 2020-12-07 DIAGNOSIS — L820 Inflamed seborrheic keratosis: Secondary | ICD-10-CM

## 2020-12-07 DIAGNOSIS — Z85828 Personal history of other malignant neoplasm of skin: Secondary | ICD-10-CM | POA: Diagnosis not present

## 2020-12-07 DIAGNOSIS — B079 Viral wart, unspecified: Secondary | ICD-10-CM

## 2020-12-07 DIAGNOSIS — L821 Other seborrheic keratosis: Secondary | ICD-10-CM | POA: Diagnosis not present

## 2020-12-07 DIAGNOSIS — L578 Other skin changes due to chronic exposure to nonionizing radiation: Secondary | ICD-10-CM

## 2020-12-07 NOTE — Patient Instructions (Signed)

## 2020-12-07 NOTE — Progress Notes (Signed)
   Follow-Up Visit   Subjective  Bruce Garza is a 75 y.o. male who presents for the following: Follow-up (Wart of right volar index finger - treated with LN2 and canthacur injections). He also has a spot on his eyelid that is irritating he would like treated. He has history of basal cell carcinoma of the left nasal bridge/medial canthus excised November 2021  The following portions of the chart were reviewed this encounter and updated as appropriate:   Tobacco  Allergies  Meds  Problems  Med Hx  Surg Hx  Fam Hx     Review of Systems:  No other skin or systemic complaints except as noted in HPI or Assessment and Plan.  Objective  Well appearing patient in no apparent distress; mood and affect are within normal limits.  A focused examination was performed including right hand. Relevant physical exam findings are noted in the Assessment and Plan.  Objective  Left Lower Eyelid Margin: Erythematous keratotic or waxy stuck-on papule or plaque.    Assessment & Plan    Actinic Damage - chronic, secondary to cumulative UV radiation exposure/sun exposure over time - diffuse scaly erythematous macules with underlying dyspigmentation - Recommend daily broad spectrum sunscreen SPF 30+ to sun-exposed areas, reapply every 2 hours as needed.  - Call for new or changing lesions.  Seborrheic Keratoses - Stuck-on, waxy, tan-brown papules and plaques  - Discussed benign etiology and prognosis. 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  Biopsy proven Viral Wart --  Destruction by Immunotherapy with Candida Antigen today: Intralesional injection - Right volar index finger Location: Right volar index finger  Informed Consent: Discussed risks (infection, pain, bleeding, bruising, thinning of the skin, loss of skin pigment, lack of resolution, and recurrence of lesion) and benefits of the procedure, as well as the alternatives. Informed consent was obtained. Preparation: The area was prepared a standard fashion.  Anesthesia:none  Procedure Details: An intralesional injection was performed with candida antigen. 0.25 cc in total were injected. Lot # [id number] Exp 09/16/2021  Total number of injections: 1  Plan: The patient was instructed on post-op care. Recommend OTC analgesia as needed for pain.  History of Basal Cell Carcinoma of the Skin -left nasal bridge/left medial canthus lower eyelid - No evidence of recurrence today - Recommend regular full body skin exams - Recommend daily broad spectrum sunscreen SPF 30+ to sun-exposed areas, reapply every 2 hours as needed.  - Call if any new or changing lesions are noted between office visits  Return 4-6 weeks for wart follow up.  I, Ashok Cordia, CMA, am acting as scribe for Sarina Ser, MD .  Documentation: I have reviewed the above documentation for accuracy and completeness, and I agree with the above.  Sarina Ser, MD

## 2020-12-10 ENCOUNTER — Encounter: Payer: Self-pay | Admitting: Dermatology

## 2020-12-29 ENCOUNTER — Inpatient Hospital Stay: Payer: Medicare HMO | Attending: Oncology

## 2020-12-29 ENCOUNTER — Other Ambulatory Visit: Payer: Self-pay

## 2020-12-29 DIAGNOSIS — Z888 Allergy status to other drugs, medicaments and biological substances status: Secondary | ICD-10-CM | POA: Diagnosis not present

## 2020-12-29 DIAGNOSIS — N183 Chronic kidney disease, stage 3 unspecified: Secondary | ICD-10-CM | POA: Insufficient documentation

## 2020-12-29 DIAGNOSIS — D472 Monoclonal gammopathy: Secondary | ICD-10-CM | POA: Diagnosis not present

## 2020-12-29 DIAGNOSIS — I129 Hypertensive chronic kidney disease with stage 1 through stage 4 chronic kidney disease, or unspecified chronic kidney disease: Secondary | ICD-10-CM | POA: Diagnosis not present

## 2020-12-29 DIAGNOSIS — E785 Hyperlipidemia, unspecified: Secondary | ICD-10-CM | POA: Insufficient documentation

## 2020-12-29 DIAGNOSIS — Z85828 Personal history of other malignant neoplasm of skin: Secondary | ICD-10-CM | POA: Diagnosis not present

## 2020-12-29 DIAGNOSIS — Z79899 Other long term (current) drug therapy: Secondary | ICD-10-CM | POA: Diagnosis not present

## 2020-12-29 LAB — CBC WITH DIFFERENTIAL/PLATELET
Abs Immature Granulocytes: 0.03 10*3/uL (ref 0.00–0.07)
Basophils Absolute: 0 10*3/uL (ref 0.0–0.1)
Basophils Relative: 0 %
Eosinophils Absolute: 0.3 10*3/uL (ref 0.0–0.5)
Eosinophils Relative: 4 %
HCT: 40.1 % (ref 39.0–52.0)
Hemoglobin: 13.5 g/dL (ref 13.0–17.0)
Immature Granulocytes: 0 %
Lymphocytes Relative: 24 %
Lymphs Abs: 1.8 10*3/uL (ref 0.7–4.0)
MCH: 32.7 pg (ref 26.0–34.0)
MCHC: 33.7 g/dL (ref 30.0–36.0)
MCV: 97.1 fL (ref 80.0–100.0)
Monocytes Absolute: 0.5 10*3/uL (ref 0.1–1.0)
Monocytes Relative: 6 %
Neutro Abs: 5 10*3/uL (ref 1.7–7.7)
Neutrophils Relative %: 66 %
Platelets: 176 10*3/uL (ref 150–400)
RBC: 4.13 MIL/uL — ABNORMAL LOW (ref 4.22–5.81)
RDW: 12.7 % (ref 11.5–15.5)
WBC: 7.7 10*3/uL (ref 4.0–10.5)
nRBC: 0 % (ref 0.0–0.2)

## 2020-12-29 LAB — COMPREHENSIVE METABOLIC PANEL
ALT: 26 U/L (ref 0–44)
AST: 24 U/L (ref 15–41)
Albumin: 4.2 g/dL (ref 3.5–5.0)
Alkaline Phosphatase: 62 U/L (ref 38–126)
Anion gap: 10 (ref 5–15)
BUN: 25 mg/dL — ABNORMAL HIGH (ref 8–23)
CO2: 22 mmol/L (ref 22–32)
Calcium: 8.9 mg/dL (ref 8.9–10.3)
Chloride: 106 mmol/L (ref 98–111)
Creatinine, Ser: 1.63 mg/dL — ABNORMAL HIGH (ref 0.61–1.24)
GFR, Estimated: 44 mL/min — ABNORMAL LOW (ref >60)
Glucose, Bld: 213 mg/dL — ABNORMAL HIGH (ref 70–99)
Potassium: 4.2 mmol/L (ref 3.5–5.1)
Sodium: 138 mmol/L (ref 135–145)
Total Bilirubin: 1.1 mg/dL (ref 0.3–1.2)
Total Protein: 6.9 g/dL (ref 6.5–8.1)

## 2020-12-30 LAB — KAPPA/LAMBDA LIGHT CHAINS
Kappa free light chain: 55.1 mg/L — ABNORMAL HIGH (ref 3.3–19.4)
Kappa, lambda light chain ratio: 3.28 — ABNORMAL HIGH (ref 0.26–1.65)
Lambda free light chains: 16.8 mg/L (ref 5.7–26.3)

## 2021-01-02 LAB — MULTIPLE MYELOMA PANEL, SERUM
Albumin SerPl Elph-Mcnc: 3.8 g/dL (ref 2.9–4.4)
Albumin/Glob SerPl: 1.6 (ref 0.7–1.7)
Alpha 1: 0.2 g/dL (ref 0.0–0.4)
Alpha2 Glob SerPl Elph-Mcnc: 0.6 g/dL (ref 0.4–1.0)
B-Globulin SerPl Elph-Mcnc: 1 g/dL (ref 0.7–1.3)
Gamma Glob SerPl Elph-Mcnc: 0.7 g/dL (ref 0.4–1.8)
Globulin, Total: 2.5 g/dL (ref 2.2–3.9)
IgA: 371 mg/dL (ref 61–437)
IgG (Immunoglobin G), Serum: 764 mg/dL (ref 603–1613)
IgM (Immunoglobulin M), Srm: 50 mg/dL (ref 15–143)
Total Protein ELP: 6.3 g/dL (ref 6.0–8.5)

## 2021-01-05 ENCOUNTER — Inpatient Hospital Stay (HOSPITAL_BASED_OUTPATIENT_CLINIC_OR_DEPARTMENT_OTHER): Payer: Medicare HMO | Admitting: Oncology

## 2021-01-05 ENCOUNTER — Encounter: Payer: Self-pay | Admitting: Oncology

## 2021-01-05 VITALS — BP 121/57 | HR 64 | Temp 96.3°F | Resp 18 | Wt 190.5 lb

## 2021-01-05 DIAGNOSIS — Z79899 Other long term (current) drug therapy: Secondary | ICD-10-CM | POA: Diagnosis not present

## 2021-01-05 DIAGNOSIS — Z888 Allergy status to other drugs, medicaments and biological substances status: Secondary | ICD-10-CM | POA: Diagnosis not present

## 2021-01-05 DIAGNOSIS — I129 Hypertensive chronic kidney disease with stage 1 through stage 4 chronic kidney disease, or unspecified chronic kidney disease: Secondary | ICD-10-CM | POA: Diagnosis not present

## 2021-01-05 DIAGNOSIS — D472 Monoclonal gammopathy: Secondary | ICD-10-CM

## 2021-01-05 DIAGNOSIS — N183 Chronic kidney disease, stage 3 unspecified: Secondary | ICD-10-CM | POA: Diagnosis not present

## 2021-01-05 DIAGNOSIS — E785 Hyperlipidemia, unspecified: Secondary | ICD-10-CM | POA: Diagnosis not present

## 2021-01-05 DIAGNOSIS — Z85828 Personal history of other malignant neoplasm of skin: Secondary | ICD-10-CM | POA: Diagnosis not present

## 2021-01-05 NOTE — Progress Notes (Signed)
Hematology/Oncology Consult note [facility]  Telephone:([phone] [phone]  Patient Care Team: Marinda Elk, MD as PCP - General (Physician Assistant)   Name of the patient: Bruce Garza  [id number]  01/15/1946   Date of visit: 01/05/21  Diagnosis- light chain MGUS  Chief complaint/ Reason for visit- routine f/u of MGUS  Heme/Onc history: patient is a 75 year-old male with a past medical history significant for hypertension hyperlipidemia and stage III chronic kidney disease for which he sees Dr. Holley Raring. He has been referred to [place] for elevated free light chains.Most recent 6 CMP from 04/27/2019 showed creatinine of 1.4 and BUN of 25. Calcium was 9.7. Electrolytes and LFTs were normal. CBC showed a white count of 8.7, H&H of 15.9/45.9 and a platelet count of 157.  Results of blood work from 05/26/2019 were as follows: CBC was normal with an H&H of 15.4/45.4. CMP was significant for serum creatinine of 1.5. Calcium normal at 9.3. 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 Free light chain showed elevated kappa of 61 with a free light chain ratio of 3.1. 24-hour urine protein electrophoresis did not reveal any evidence of M protein.  Patient underwent bone marrow biopsy given the elevated free light chain ratio which showed 3% plasma cells but was not consistent with multiple myeloma and consistent with light chain MGUS  Interval history- Patient reports feeling well. deneis any complaints. No recent hospitalizations  ECOG PS- 1 Pain scale- 0   Review of systems- Review of Systems  Constitutional: Negative for chills, fever, malaise/fatigue and weight loss.  HENT: Negative for congestion, ear discharge and nosebleeds.   Eyes: Negative for blurred vision.  Respiratory: Negative for cough, hemoptysis, sputum production, shortness of breath and wheezing.    Cardiovascular: Negative for chest pain, palpitations, orthopnea and claudication.  Gastrointestinal: Negative for abdominal pain, blood in stool, constipation, diarrhea, heartburn, melena, nausea and vomiting.  Genitourinary: Negative for dysuria, flank pain, frequency, hematuria and urgency.  Musculoskeletal: Negative for back pain, joint pain and myalgias.  Skin: Negative for rash.  Neurological: Negative for dizziness, tingling, focal weakness, seizures, weakness and headaches.  Endo/Heme/Allergies: Does not bruise/bleed easily.  Psychiatric/Behavioral: Negative for depression and suicidal ideas. The patient does not have insomnia.       Allergies  Allergen Reactions  . Lisinopril Swelling and Other (See Comments)    _0  Facial Facial      Past Medical History:  Diagnosis Date  . Basal cell carcinoma 07/04/2020   left nasal bridge/L med canthus/lower eyelid. Excised 09/06/2020, margins free.  . Elevated serum immunoglobulin free light chains   . History of shingles   . Hyperlipidemia   . Hypertension   . MGUS (monoclonal gammopathy of unknown significance)      Past Surgical History:  Procedure Laterality Date  . COLONOSCOPY WITH PROPOFOL N/A 07/22/2015   Procedure: COLONOSCOPY WITH PROPOFOL;  Surgeon: Lollie Sails, MD;  Location: [facility];  Service: Endoscopy;  Laterality: N/A;  . excision ganglion cyst wrist      Social History   Socioeconomic History  . Marital status: Married    Spouse name: pam  . Number of children: Not on file  . Years of education: Not on file  . Highest education level: Not on file  Occupational History  . Not on file  Tobacco Use  . Smoking status: Never Smoker  . Smokeless  tobacco: Never Used  Vaping Use  . Vaping Use: Never used  Substance and Sexual Activity  . Alcohol use: Never  . Drug use: Never  . Sexual activity: Not on file  Other Topics Concern  . Not on file  Social  History Narrative  . Not on file   Social Determinants of Health   Financial Resource Strain: Not on file  Food Insecurity: Not on file  Transportation Needs: Not on file  Physical Activity: Not on file  Stress: Not on file  Social Connections: Not on file  Intimate Partner Violence: Not on file    No family history on file.   Current Outpatient Medications:  .  amLODipine (NORVASC) 10 MG tablet, Take 10 mg by mouth daily., Disp: , Rfl:  .  ascorbic acid (VITAMIN C) 1000 MG tablet, Take 1,000 mg by mouth daily., Disp: , Rfl:  .  Cholecalciferol (VITAMIN D3) 25 MCG (1000 UT) CAPS, Take by mouth., Disp: , Rfl:  .  ketorolac (ACULAR) 0.5 % ophthalmic solution, Apply 1 drop into right eye four times a day  for 5 days, Disp: , Rfl:  .  latanoprost (XALATAN) 0.005 % ophthalmic solution, Apply to eye., Disp: , Rfl:  .  losartan (COZAAR) 25 MG tablet, Take 25 mg by mouth daily. Every evening, Disp: , Rfl:  .  Multiple Vitamins-Minerals (OCUVITE ADULT 50+ PO), Take 1 capsule by mouth in the morning and at bedtime. , Disp: , Rfl:  .  mupirocin ointment (BACTROBAN) 2 %, Apply 1 application topically daily. With dressing changes, Disp: 22 g, Rfl: 0 .  prednisoLONE acetate (PRED FORTE) 1 % ophthalmic suspension, 1 drop in the morning and at bedtime. , Disp: , Rfl:  .  rosuvastatin (CRESTOR) 5 MG tablet, , Disp: , Rfl:  .  spironolactone (ALDACTONE) 25 MG tablet, Take 25 mg by mouth 2 (two) times daily., Disp: , Rfl:  .  MODERNA COVID-19 VACCINE 100 MCG/0.5ML injection, , Disp: , Rfl:   Physical exam:  Vitals:   01/05/21 1022  BP: (!) 121/57  Pulse: 64  Resp: 18  Temp: (!) 96.3 F (35.7 C)  TempSrc: Tympanic  SpO2: 98%  Weight: 190 lb 8 oz (86.4 kg)   Physical Exam Constitutional:      General: He is not in acute distress. Cardiovascular:     Rate and Rhythm: Normal rate and regular rhythm.     Heart sounds: Normal heart sounds.  Pulmonary:     Effort: Pulmonary effort is normal.      Breath sounds: Normal breath sounds.  Skin:    General: Skin is warm and dry.  Neurological:     Mental Status: He is alert and oriented to person, place, and time.      CMP Latest Ref Rng & Units 12/29/2020  Glucose 70 - 99 mg/dL 213(H)  BUN 8 - 23 mg/dL 25(H)  Creatinine 0.61 - 1.24 mg/dL 1.63(H)  Sodium 135 - 145 mmol/L 138  Potassium 3.5 - 5.1 mmol/L 4.2  Chloride 98 - 111 mmol/L 106  CO2 22 - 32 mmol/L 22  Calcium 8.9 - 10.3 mg/dL 8.9  Total Protein 6.5 - 8.1 g/dL 6.9  Total Bilirubin 0.3 - 1.2 mg/dL 1.1  Alkaline Phos 38 - 126 U/L 62  AST 15 - 41 U/L 24  ALT 0 - 44 U/L 26   CBC Latest Ref Rng & Units 12/29/2020  WBC 4.0 - 10.5 K/uL 7.7  Hemoglobin 13.0 - 17.0 g/dL 13.5  Hematocrit 39.0 - 52.0 % 40.1  Platelets 150 - 400 K/uL 176     Assessment and plan- Patient is a 75 y.o. male  with light chain MGUS here for routine f/u  CBC shows white count of 7.7, H&H of 13.5/40.1 with a platelet count of 176.  He does have baseline CKD but his creatinine is stable around 1.6.  Myeloma panel shows no M protein on SPEP but IgA kappa light chain protein noted on immunofixation.  Serum free light chain ratio has remained stable between 2.5-3.5.  Continue active surveillance and I will see him back in 1 year with labs prior    Visit Diagnosis 1. MGUS (monoclonal gammopathy of unknown significance)      Dr. Randa Evens, MD, MPH [facility] [phone] 01/05/2021 1:01 PM

## 2021-01-16 ENCOUNTER — Ambulatory Visit: Payer: Medicare HMO | Admitting: Dermatology

## 2021-01-16 ENCOUNTER — Encounter: Payer: Self-pay | Admitting: Dermatology

## 2021-01-16 ENCOUNTER — Other Ambulatory Visit: Payer: Self-pay

## 2021-01-16 DIAGNOSIS — B079 Viral wart, unspecified: Secondary | ICD-10-CM | POA: Diagnosis not present

## 2021-01-16 NOTE — Patient Instructions (Signed)

## 2021-01-16 NOTE — Progress Notes (Signed)
   Follow-Up Visit   Subjective  Bruce Garza is a 75 y.o. male who presents for the following: wart (Bx proven wart of the R volar index finger - previously treated with Candida antigen 0.25cc ).  The following portions of the chart were reviewed this encounter and updated as appropriate:   Tobacco  Allergies  Meds  Problems  Med Hx  Surg Hx  Fam Hx     Review of Systems:  No other skin or systemic complaints except as noted in HPI or Assessment and Plan.  Objective  Well appearing patient in no apparent distress; mood and affect are within normal limits.  A focused examination was performed including the hands . Relevant physical exam findings are noted in the Assessment and Plan.  Objective  R volar index finger: 1.0 cm Verrucous papule -- Discussed viral etiology and contagion.  Entire lesion measures 2.0 cm   Assessment & Plan  Viral wart - 1 cm verrucous plaque with 2 cm surrounding swelling. 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  Intralesional injection - R volar index finger Location: R volar index finger   Informed Consent: Discussed risks (infection, pain, bleeding, bruising, thinning of the skin, loss of skin pigment, lack of resolution, and recurrence of lesion) and benefits of the procedure, as well as the alternatives. Informed consent was obtained. Preparation: The area was prepared a standard fashion.  IMMUNOTHERAPY: Procedure Details: An intralesional injection was performed with candida antigen. 0.3 cc in total were injected.  Total number of injections: 4  Plan: The patient was instructed on post-op care. Recommend OTC analgesia as needed for pain.  Destruction of lesion - R volar index finger Complexity: simple   Destruction method: cryotherapy   Informed consent: discussed and consent obtained   Timeout:   patient name, date of birth, surgical site, and procedure verified Lesion destroyed using liquid nitrogen: Yes   Region frozen until ice ball extended beyond lesion: Yes   Outcome: patient tolerated procedure well with no complications   Post-procedure details: wound care instructions given    Return for 4-6 weeks for wart follow up .  Luther Redo, CMA, am acting as scribe for Sarina Ser, MD .  Documentation: I have reviewed the above documentation for accuracy and completeness, and I agree with the above.  Sarina Ser, MD

## 2021-02-06 DIAGNOSIS — N1832 Chronic kidney disease, stage 3b: Secondary | ICD-10-CM | POA: Diagnosis not present

## 2021-02-06 DIAGNOSIS — D472 Monoclonal gammopathy: Secondary | ICD-10-CM | POA: Diagnosis not present

## 2021-02-06 DIAGNOSIS — I10 Essential (primary) hypertension: Secondary | ICD-10-CM | POA: Diagnosis not present

## 2021-02-22 ENCOUNTER — Ambulatory Visit: Payer: Medicare HMO | Admitting: Dermatology

## 2021-02-22 DIAGNOSIS — G4733 Obstructive sleep apnea (adult) (pediatric): Secondary | ICD-10-CM | POA: Diagnosis not present

## 2021-03-08 ENCOUNTER — Other Ambulatory Visit: Payer: Self-pay

## 2021-03-08 ENCOUNTER — Ambulatory Visit: Payer: Medicare HMO | Admitting: Dermatology

## 2021-03-08 DIAGNOSIS — L814 Other melanin hyperpigmentation: Secondary | ICD-10-CM | POA: Diagnosis not present

## 2021-03-08 DIAGNOSIS — D1800 Hemangioma unspecified site: Secondary | ICD-10-CM

## 2021-03-08 DIAGNOSIS — D229 Melanocytic nevi, unspecified: Secondary | ICD-10-CM | POA: Diagnosis not present

## 2021-03-08 DIAGNOSIS — Z1283 Encounter for screening for malignant neoplasm of skin: Secondary | ICD-10-CM | POA: Diagnosis not present

## 2021-03-08 DIAGNOSIS — L821 Other seborrheic keratosis: Secondary | ICD-10-CM

## 2021-03-08 DIAGNOSIS — Z85828 Personal history of other malignant neoplasm of skin: Secondary | ICD-10-CM | POA: Diagnosis not present

## 2021-03-08 DIAGNOSIS — L578 Other skin changes due to chronic exposure to nonionizing radiation: Secondary | ICD-10-CM

## 2021-03-08 DIAGNOSIS — B078 Other viral warts: Secondary | ICD-10-CM | POA: Diagnosis not present

## 2021-03-08 NOTE — Progress Notes (Signed)
   Follow-Up Visit   Subjective  Bruce Garza is a 75 y.o. male who presents for the following: Warts (4 weeks f/u wart on the R volar index finger, treating with Candida injections with a fair response ). Hx of BCC, mole check today.  The patient presents for Total-Body Skin Exam (TBSE) for skin cancer screening and mole check.  The following portions of the chart were reviewed this encounter and updated as appropriate:   Tobacco  Allergies  Meds  Problems  Med Hx  Surg Hx  Fam Hx      Review of Systems:  No other skin or systemic complaints except as noted in HPI or Assessment and Plan.  Objective  Well appearing patient in no apparent distress; mood and affect are within normal limits.  All skin entire body examined.  Objective  Right volar index finger: 2.0 x 1.5 cm soft papule with central verrucous patch measuring about .7 cm  -- Discussed viral etiology and contagion.    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  This is not improved with treatment for vertigo with liquid nitrogen and other topical agents and intralesional Candida antigen immunotherapy for wart. Chronic and persistent Recommend referral to Dr Claudia Desanctis - Hand surgeon/Plastic Surgeon -  pt declines referral today, he would like to observe for changes   Skin cancer screening  Lentigines - Scattered tan macules - Due to sun exposure - Benign-appering, observe - Recommend daily broad spectrum sunscreen SPF 30+ to sun-exposed areas, reapply every 2 hours as needed. - Call for any changes  Seborrheic Keratoses - Stuck-on, waxy, tan-brown papules and/or plaques  - Benign-appearing - Discussed benign etiology and prognosis. - Observe - Call for any changes  Melanocytic Nevi - Tan-brown and/or pink-flesh-colored symmetric macules and papules - Benign appearing on exam today - Observation - Call clinic for new or  changing moles - Recommend daily use of broad spectrum spf 30+ sunscreen to sun-exposed areas.   Hemangiomas - Red papules - Discussed benign nature - Observe - Call for any changes  Actinic Damage - Chronic condition, secondary to cumulative UV/sun exposure - diffuse scaly erythematous macules with underlying dyspigmentation - Recommend daily broad spectrum sunscreen SPF 30+ to sun-exposed areas, reapply every 2 hours as needed.  - Staying in the shade or wearing long sleeves, sun glasses (UVA+UVB protection) and wide brim hats (4-inch brim around the entire circumference of the hat) are also recommended for sun protection.  - Call for new or changing lesions.  History of Basal Cell Carcinoma of the Skin Multiple see history  - No evidence of recurrence today - Recommend regular full body skin exams - Recommend daily broad spectrum sunscreen SPF 30+ to sun-exposed areas, reapply every 2 hours as needed.  - Call if any new or changing lesions are noted between office visits  Skin cancer screening performed today.  Return in about 1 year (around 03/08/2022) for TBSE .  IMarye Round, CMA, am acting as scribe for Sarina Ser, MD .  Documentation: I have reviewed the above documentation for accuracy and completeness, and I agree with the above.  Sarina Ser, MD

## 2021-03-08 NOTE — Patient Instructions (Signed)

## 2021-03-11 ENCOUNTER — Encounter: Payer: Self-pay | Admitting: Dermatology

## 2021-03-17 DIAGNOSIS — H401133 Primary open-angle glaucoma, bilateral, severe stage: Secondary | ICD-10-CM | POA: Diagnosis not present

## 2021-03-20 DIAGNOSIS — Z125 Encounter for screening for malignant neoplasm of prostate: Secondary | ICD-10-CM | POA: Diagnosis not present

## 2021-03-20 DIAGNOSIS — E782 Mixed hyperlipidemia: Secondary | ICD-10-CM | POA: Diagnosis not present

## 2021-03-20 DIAGNOSIS — I10 Essential (primary) hypertension: Secondary | ICD-10-CM | POA: Diagnosis not present

## 2021-03-20 DIAGNOSIS — R7303 Prediabetes: Secondary | ICD-10-CM | POA: Diagnosis not present

## 2021-03-29 DIAGNOSIS — I129 Hypertensive chronic kidney disease with stage 1 through stage 4 chronic kidney disease, or unspecified chronic kidney disease: Secondary | ICD-10-CM | POA: Diagnosis not present

## 2021-03-29 DIAGNOSIS — E782 Mixed hyperlipidemia: Secondary | ICD-10-CM | POA: Diagnosis not present

## 2021-03-29 DIAGNOSIS — D472 Monoclonal gammopathy: Secondary | ICD-10-CM | POA: Diagnosis not present

## 2021-03-29 DIAGNOSIS — G4733 Obstructive sleep apnea (adult) (pediatric): Secondary | ICD-10-CM | POA: Diagnosis not present

## 2021-03-29 DIAGNOSIS — Z0000 Encounter for general adult medical examination without abnormal findings: Secondary | ICD-10-CM | POA: Diagnosis not present

## 2021-03-29 DIAGNOSIS — R7303 Prediabetes: Secondary | ICD-10-CM | POA: Diagnosis not present

## 2021-03-29 DIAGNOSIS — N1831 Chronic kidney disease, stage 3a: Secondary | ICD-10-CM | POA: Diagnosis not present

## 2021-03-29 DIAGNOSIS — E876 Hypokalemia: Secondary | ICD-10-CM | POA: Diagnosis not present

## 2021-04-24 DIAGNOSIS — H401133 Primary open-angle glaucoma, bilateral, severe stage: Secondary | ICD-10-CM | POA: Diagnosis not present

## 2021-05-24 DIAGNOSIS — G4733 Obstructive sleep apnea (adult) (pediatric): Secondary | ICD-10-CM | POA: Diagnosis not present

## 2021-06-02 DIAGNOSIS — H93293 Other abnormal auditory perceptions, bilateral: Secondary | ICD-10-CM | POA: Diagnosis not present

## 2021-06-02 DIAGNOSIS — H6123 Impacted cerumen, bilateral: Secondary | ICD-10-CM | POA: Diagnosis not present

## 2021-06-12 DIAGNOSIS — I10 Essential (primary) hypertension: Secondary | ICD-10-CM | POA: Diagnosis not present

## 2021-06-12 DIAGNOSIS — D472 Monoclonal gammopathy: Secondary | ICD-10-CM | POA: Diagnosis not present

## 2021-06-12 DIAGNOSIS — N1832 Chronic kidney disease, stage 3b: Secondary | ICD-10-CM | POA: Diagnosis not present

## 2021-08-23 DIAGNOSIS — H401133 Primary open-angle glaucoma, bilateral, severe stage: Secondary | ICD-10-CM | POA: Diagnosis not present

## 2021-09-26 DIAGNOSIS — E782 Mixed hyperlipidemia: Secondary | ICD-10-CM | POA: Diagnosis not present

## 2021-09-26 DIAGNOSIS — R7303 Prediabetes: Secondary | ICD-10-CM | POA: Diagnosis not present

## 2021-09-26 DIAGNOSIS — I10 Essential (primary) hypertension: Secondary | ICD-10-CM | POA: Diagnosis not present

## 2021-09-29 DIAGNOSIS — Z0100 Encounter for examination of eyes and vision without abnormal findings: Secondary | ICD-10-CM | POA: Diagnosis not present

## 2021-09-29 DIAGNOSIS — H353132 Nonexudative age-related macular degeneration, bilateral, intermediate dry stage: Secondary | ICD-10-CM | POA: Diagnosis not present

## 2021-10-03 DIAGNOSIS — I129 Hypertensive chronic kidney disease with stage 1 through stage 4 chronic kidney disease, or unspecified chronic kidney disease: Secondary | ICD-10-CM | POA: Diagnosis not present

## 2021-10-03 DIAGNOSIS — Z0000 Encounter for general adult medical examination without abnormal findings: Secondary | ICD-10-CM | POA: Diagnosis not present

## 2021-10-03 DIAGNOSIS — N1831 Chronic kidney disease, stage 3a: Secondary | ICD-10-CM | POA: Diagnosis not present

## 2021-10-03 DIAGNOSIS — D472 Monoclonal gammopathy: Secondary | ICD-10-CM | POA: Diagnosis not present

## 2021-10-03 DIAGNOSIS — G4733 Obstructive sleep apnea (adult) (pediatric): Secondary | ICD-10-CM | POA: Diagnosis not present

## 2021-10-03 DIAGNOSIS — E782 Mixed hyperlipidemia: Secondary | ICD-10-CM | POA: Diagnosis not present

## 2021-10-03 DIAGNOSIS — R7303 Prediabetes: Secondary | ICD-10-CM | POA: Diagnosis not present

## 2021-10-03 DIAGNOSIS — Z23 Encounter for immunization: Secondary | ICD-10-CM | POA: Diagnosis not present

## 2021-10-12 DIAGNOSIS — D472 Monoclonal gammopathy: Secondary | ICD-10-CM | POA: Diagnosis not present

## 2021-10-12 DIAGNOSIS — N1831 Chronic kidney disease, stage 3a: Secondary | ICD-10-CM | POA: Diagnosis not present

## 2021-10-12 DIAGNOSIS — I10 Essential (primary) hypertension: Secondary | ICD-10-CM | POA: Diagnosis not present

## 2021-12-18 DIAGNOSIS — S39012A Strain of muscle, fascia and tendon of lower back, initial encounter: Secondary | ICD-10-CM | POA: Diagnosis not present

## 2021-12-29 ENCOUNTER — Inpatient Hospital Stay: Payer: Medicare HMO | Attending: Oncology

## 2021-12-29 ENCOUNTER — Other Ambulatory Visit: Payer: Self-pay

## 2021-12-29 DIAGNOSIS — D472 Monoclonal gammopathy: Secondary | ICD-10-CM | POA: Insufficient documentation

## 2021-12-29 DIAGNOSIS — Z79899 Other long term (current) drug therapy: Secondary | ICD-10-CM | POA: Diagnosis not present

## 2021-12-29 LAB — CBC WITH DIFFERENTIAL/PLATELET
Abs Immature Granulocytes: 0.04 10*3/uL (ref 0.00–0.07)
Basophils Absolute: 0 10*3/uL (ref 0.0–0.1)
Basophils Relative: 0 %
Eosinophils Absolute: 0.4 10*3/uL (ref 0.0–0.5)
Eosinophils Relative: 3 %
HCT: 43.2 % (ref 39.0–52.0)
Hemoglobin: 14.6 g/dL (ref 13.0–17.0)
Immature Granulocytes: 0 %
Lymphocytes Relative: 17 %
Lymphs Abs: 2.1 10*3/uL (ref 0.7–4.0)
MCH: 33 pg (ref 26.0–34.0)
MCHC: 33.8 g/dL (ref 30.0–36.0)
MCV: 97.5 fL (ref 80.0–100.0)
Monocytes Absolute: 0.8 10*3/uL (ref 0.1–1.0)
Monocytes Relative: 7 %
Neutro Abs: 9.2 10*3/uL — ABNORMAL HIGH (ref 1.7–7.7)
Neutrophils Relative %: 73 %
Platelets: 142 10*3/uL — ABNORMAL LOW (ref 150–400)
RBC: 4.43 MIL/uL (ref 4.22–5.81)
RDW: 12.4 % (ref 11.5–15.5)
WBC: 12.5 10*3/uL — ABNORMAL HIGH (ref 4.0–10.5)
nRBC: 0 % (ref 0.0–0.2)

## 2021-12-29 LAB — COMPREHENSIVE METABOLIC PANEL
ALT: 32 U/L (ref 0–44)
AST: 23 U/L (ref 15–41)
Albumin: 4.2 g/dL (ref 3.5–5.0)
Alkaline Phosphatase: 75 U/L (ref 38–126)
Anion gap: 8 (ref 5–15)
BUN: 27 mg/dL — ABNORMAL HIGH (ref 8–23)
CO2: 26 mmol/L (ref 22–32)
Calcium: 9.3 mg/dL (ref 8.9–10.3)
Chloride: 102 mmol/L (ref 98–111)
Creatinine, Ser: 1.54 mg/dL — ABNORMAL HIGH (ref 0.61–1.24)
GFR, Estimated: 47 mL/min — ABNORMAL LOW (ref >60)
Glucose, Bld: 157 mg/dL — ABNORMAL HIGH (ref 70–99)
Potassium: 4.4 mmol/L (ref 3.5–5.1)
Sodium: 136 mmol/L (ref 135–145)
Total Bilirubin: 0.9 mg/dL (ref 0.3–1.2)
Total Protein: 7 g/dL (ref 6.5–8.1)

## 2022-01-01 LAB — KAPPA/LAMBDA LIGHT CHAINS
Kappa free light chain: 75.9 mg/L — ABNORMAL HIGH (ref 3.3–19.4)
Kappa, lambda light chain ratio: 4.93 — ABNORMAL HIGH (ref 0.26–1.65)
Lambda free light chains: 15.4 mg/L (ref 5.7–26.3)

## 2022-01-02 LAB — MULTIPLE MYELOMA PANEL, SERUM
Albumin SerPl Elph-Mcnc: 3.8 g/dL (ref 2.9–4.4)
Albumin/Glob SerPl: 1.4 (ref 0.7–1.7)
Alpha 1: 0.3 g/dL (ref 0.0–0.4)
Alpha2 Glob SerPl Elph-Mcnc: 0.7 g/dL (ref 0.4–1.0)
B-Globulin SerPl Elph-Mcnc: 1.2 g/dL (ref 0.7–1.3)
Gamma Glob SerPl Elph-Mcnc: 0.7 g/dL (ref 0.4–1.8)
Globulin, Total: 2.9 g/dL (ref 2.2–3.9)
IgA: 402 mg/dL (ref 61–437)
IgG (Immunoglobin G), Serum: 738 mg/dL (ref 603–1613)
IgM (Immunoglobulin M), Srm: 45 mg/dL (ref 15–143)
M Protein SerPl Elph-Mcnc: 0.4 g/dL — ABNORMAL HIGH
Total Protein ELP: 6.7 g/dL (ref 6.0–8.5)

## 2022-01-05 ENCOUNTER — Other Ambulatory Visit: Payer: Self-pay

## 2022-01-05 ENCOUNTER — Encounter: Payer: Self-pay | Admitting: Oncology

## 2022-01-05 ENCOUNTER — Inpatient Hospital Stay: Payer: Medicare HMO | Admitting: Oncology

## 2022-01-05 ENCOUNTER — Other Ambulatory Visit: Payer: Medicare HMO

## 2022-01-05 VITALS — BP 103/62 | HR 68 | Temp 98.5°F | Resp 17 | Wt 185.0 lb

## 2022-01-05 DIAGNOSIS — D472 Monoclonal gammopathy: Secondary | ICD-10-CM

## 2022-01-05 DIAGNOSIS — Z79899 Other long term (current) drug therapy: Secondary | ICD-10-CM | POA: Diagnosis not present

## 2022-01-05 NOTE — Progress Notes (Signed)
Patient here for oncology follow-up appointment, expresses no new complaints or concerns at this time.   

## 2022-01-07 NOTE — Progress Notes (Signed)
? ? ? ?Hematology/Oncology Consult note ?[place]  ?[phone] [phone] ? ?Patient Care Team: ?Marinda Elk, MD as PCP - General (Physician Assistant)  ? ?Name of the patient: Bruce Garza  ?[id number]  ?Sep 09, 1946  ? ?Date of visit: 01/07/22 ? ?Diagnosis-IgA MGUS ?  ? ?Chief complaint/ Reason for visit-routine follow-up of IgA kappa chain MGUS ? ?Heme/Onc history: patient is a 76 year-old male with a past medical history significant for hypertension hyperlipidemia and stage III chronic kidney disease for which he sees Dr. Holley Raring.  He has been referred to [place] for elevated free light chains.Most recent 6 CMP from 04/27/2019 showed creatinine of 1.4 and BUN of 25.  Calcium was 9.7.  Electrolytes and LFTs were normal.  CBC showed a white count of 8.7, H&H of 15.9/45.9 and a platelet count of 157. ?  ?Results of blood work from 05/26/2019 were as follows: CBC was normal with an H&H of 15.4/45.4.  CMP was significant for serum creatinine of 1.5.  Calcium normal at 9.3.  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  Free light chain showed elevated kappa of 61 with a free light chain ratio of 3.1.  24-hour urine protein electrophoresis did not reveal any evidence of M protein. ?  ?Patient underwent bone marrow biopsy given the elevated free light chain ratio which showed 3% plasma cells but was not consistent with multiple myeloma and consistent with light chain MGUS ? ?Interval history-patient is feeling well and is at his baseline state of health.  Denies any specific complaints at this time ? ?ECOG PS- 1 ?Pain scale- 0 ? ?Review of systems- Review of Systems  ?Constitutional:  Negative for chills, fever, malaise/fatigue and weight loss.  ?HENT:  Negative for congestion, ear discharge and nosebleeds.   ?Eyes:  Negative for blurred vision.  ?Respiratory:  Negative for cough, hemoptysis, sputum  production, shortness of breath and wheezing.   ?Cardiovascular:  Negative for chest pain, palpitations, orthopnea and claudication.  ?Gastrointestinal:  Negative for abdominal pain, blood in stool, constipation, diarrhea, heartburn, melena, nausea and vomiting.  ?Genitourinary:  Negative for dysuria, flank pain, frequency, hematuria and urgency.  ?Musculoskeletal:  Negative for back pain, joint pain and myalgias.  ?Skin:  Negative for rash.  ?Neurological:  Negative for dizziness, tingling, focal weakness, seizures, weakness and headaches.  ?Endo/Heme/Allergies:  Does not bruise/bleed easily.  ?Psychiatric/Behavioral:  Negative for depression and suicidal ideas. The patient does not have insomnia.    ? ? ? ?Allergies  ?Allergen Reactions  ? Lisinopril Swelling and Other (See Comments)  ?  Facial ?Facial ?Facial ?Facial ?Facial ?Facial ?Facial ?  ? ? ? ?Past Medical History:  ?Diagnosis Date  ? Basal cell carcinoma 07/04/2020  ? left nasal bridge/L med canthus/lower eyelid. Excised 09/06/2020, margins free.  ? Elevated serum immunoglobulin free light chains   ? History of shingles   ? Hyperlipidemia   ? Hypertension   ? MGUS (monoclonal gammopathy of unknown significance)   ? ? ? ?Past Surgical History:  ?Procedure Laterality Date  ? COLONOSCOPY WITH PROPOFOL N/A 07/22/2015  ? Procedure: COLONOSCOPY WITH PROPOFOL;  Surgeon: Lollie Sails, MD;  Location: [facility] ENDOSCOPY;  Service: Endoscopy;  Laterality: N/A;  ? excision ganglion cyst wrist    ? ? ?Social History  ? ?Socioeconomic History  ? Marital status: Married  ?  Spouse name: pam  ? Number of children: Not on file  ? Years  of education: Not on file  ? Highest education level: Not on file  ?Occupational History  ? Not on file  ?Tobacco Use  ? Smoking status: Never  ? Smokeless tobacco: Never  ?Vaping Use  ? Vaping Use: Never used  ?Substance and Sexual Activity  ? Alcohol use: Never  ? Drug use: Never  ? Sexual activity: Not on file  ?Other Topics Concern  ?  Not on file  ?Social History Narrative  ? Not on file  ? ?Social Determinants of Health  ? ?Financial Resource Strain: Not on file  ?Food Insecurity: Not on file  ?Transportation Needs: Not on file  ?Physical Activity: Not on file  ?Stress: Not on file  ?Social Connections: Not on file  ?Intimate Partner Violence: Not on file  ? ? ?History reviewed. No pertinent family history. ? ? ?Current Outpatient Medications:  ?  amLODipine (NORVASC) 10 MG tablet, Take 10 mg by mouth daily., Disp: , Rfl:  ?  amLODipine (NORVASC) 10 MG tablet, Take 1 tablet by mouth daily., Disp: , Rfl:  ?  ascorbic acid (VITAMIN C) 1000 MG tablet, Take 1,000 mg by mouth daily., Disp: , Rfl:  ?  baclofen (LIORESAL) 10 MG tablet, Take 10 mg by mouth 3 (three) times daily., Disp: , Rfl:  ?  Cholecalciferol (VITAMIN D3) 25 MCG (1000 UT) CAPS, Take by mouth., Disp: , Rfl:  ?  Cholecalciferol 25 MCG (1000 UT) capsule, Take by mouth., Disp: , Rfl:  ?  dorzolamide-timolol (COSOPT) 22.3-6.8 MG/ML ophthalmic solution, Apply to eye., Disp: , Rfl:  ?  ketorolac (ACULAR) 0.5 % ophthalmic solution, Apply 1 drop into right eye four times a day  for 5 days, Disp: , Rfl:  ?  latanoprost (XALATAN) 0.005 % ophthalmic solution, Apply to eye., Disp: , Rfl:  ?  latanoprost (XALATAN) 0.005 % ophthalmic solution, Apply to eye., Disp: , Rfl:  ?  losartan (COZAAR) 25 MG tablet, Take 25 mg by mouth daily. Every evening, Disp: , Rfl:  ?  losartan (COZAAR) 25 MG tablet, Take 1 tablet by mouth every evening., Disp: , Rfl:  ?  MODERNA COVID-19 VACCINE 100 MCG/0.5ML injection, , Disp: , Rfl:  ?  Multiple Vitamins-Minerals (OCUVITE ADULT 50+ PO), Take 1 capsule by mouth in the morning and at bedtime. , Disp: , Rfl:  ?  mupirocin ointment (BACTROBAN) 2 %, Apply 1 application topically daily. With dressing changes, Disp: 22 g, Rfl: 0 ?  prednisoLONE acetate (PRED FORTE) 1 % ophthalmic suspension, 1 drop in the morning and at bedtime. , Disp: , Rfl:  ?  predniSONE (STERAPRED  UNI-PAK 21 TAB) 10 MG (21) TBPK tablet, 6 day taper. Take as directed with food, Disp: , Rfl:  ?  rosuvastatin (CRESTOR) 5 MG tablet, , Disp: , Rfl:  ?  spironolactone (ALDACTONE) 25 MG tablet, Take 25 mg by mouth 2 (two) times daily., Disp: , Rfl:  ?  traMADol (ULTRAM) 50 MG tablet, Take 50 mg by mouth every 6 (six) hours as needed., Disp: , Rfl:  ? ?Physical exam:  ?Vitals:  ? 01/05/22 1119  ?BP: 103/62  ?Pulse: 68  ?Resp: 17  ?Temp: 98.5 ?F (36.9 ?C)  ?TempSrc: Tympanic  ?SpO2: 98%  ?Weight: 185 lb (83.9 kg)  ? ?Physical Exam ?Cardiovascular:  ?   Rate and Rhythm: Normal rate and regular rhythm.  ?   Heart sounds: Normal heart sounds.  ?Pulmonary:  ?   Effort: Pulmonary effort is normal.  ?   Breath sounds:  Normal breath sounds.  ?Skin: ?   General: Skin is warm and dry.  ?Neurological:  ?   Mental Status: He is alert and oriented to person, place, and time.  ?  ? ? ?  Latest Ref Rng & Units 12/29/2021  ? 10:21 AM  ?CMP  ?Glucose 70 - 99 mg/dL 157    ?BUN 8 - 23 mg/dL 27    ?Creatinine 0.61 - 1.24 mg/dL 1.54    ?Sodium 135 - 145 mmol/L 136    ?Potassium 3.5 - 5.1 mmol/L 4.4    ?Chloride 98 - 111 mmol/L 102    ?CO2 22 - 32 mmol/L 26    ?Calcium 8.9 - 10.3 mg/dL 9.3    ?Total Protein 6.5 - 8.1 g/dL 7.0    ?Total Bilirubin 0.3 - 1.2 mg/dL 0.9    ?Alkaline Phos 38 - 126 U/L 75    ?AST 15 - 41 U/L 23    ?ALT 0 - 44 U/L 32    ? ? ?  Latest Ref Rng & Units 12/29/2021  ? 10:21 AM  ?CBC  ?WBC 4.0 - 10.5 K/uL 12.5    ?Hemoglobin 13.0 - 17.0 g/dL 14.6    ?Hematocrit 39.0 - 52.0 % 43.2    ?Platelets 150 - 400 K/uL 142    ? ? ?No images are attached to the encounter. ? ?No results found. ? ? ?Assessment and plan- Patient is a 76 y.o. male here for routine follow-up of IgA MGUS ? ?CBC is presently normal with no evidence of anemia.  Mild thrombocytopenia with a platelet count of 142.  Serum creatinine is at his baseline at 1.5.  No evidence of hypercalcemia.  Total protein is normal.  Myeloma panel which previously did not  detect any M protein on SPEP presently shows M protein of 0.4 g IgA kappa.  Kappa free light chains have increased from 55 a year ago to 75 presently with a ratio that is increased from 3.28-4.9.  Patient has previou

## 2022-02-12 DIAGNOSIS — I10 Essential (primary) hypertension: Secondary | ICD-10-CM | POA: Diagnosis not present

## 2022-02-12 DIAGNOSIS — N1831 Chronic kidney disease, stage 3a: Secondary | ICD-10-CM | POA: Diagnosis not present

## 2022-02-12 DIAGNOSIS — D472 Monoclonal gammopathy: Secondary | ICD-10-CM | POA: Diagnosis not present

## 2022-02-22 DIAGNOSIS — G4733 Obstructive sleep apnea (adult) (pediatric): Secondary | ICD-10-CM | POA: Diagnosis not present

## 2022-02-22 DIAGNOSIS — Z789 Other specified health status: Secondary | ICD-10-CM | POA: Diagnosis not present

## 2022-03-08 ENCOUNTER — Ambulatory Visit: Payer: Medicare HMO | Admitting: Dermatology

## 2022-03-08 DIAGNOSIS — D229 Melanocytic nevi, unspecified: Secondary | ICD-10-CM | POA: Diagnosis not present

## 2022-03-08 DIAGNOSIS — Z85828 Personal history of other malignant neoplasm of skin: Secondary | ICD-10-CM

## 2022-03-08 DIAGNOSIS — L814 Other melanin hyperpigmentation: Secondary | ICD-10-CM | POA: Diagnosis not present

## 2022-03-08 DIAGNOSIS — D1800 Hemangioma unspecified site: Secondary | ICD-10-CM

## 2022-03-08 DIAGNOSIS — L821 Other seborrheic keratosis: Secondary | ICD-10-CM

## 2022-03-08 DIAGNOSIS — L578 Other skin changes due to chronic exposure to nonionizing radiation: Secondary | ICD-10-CM | POA: Diagnosis not present

## 2022-03-08 DIAGNOSIS — Z1283 Encounter for screening for malignant neoplasm of skin: Secondary | ICD-10-CM | POA: Diagnosis not present

## 2022-03-08 DIAGNOSIS — B079 Viral wart, unspecified: Secondary | ICD-10-CM

## 2022-03-08 NOTE — Progress Notes (Addendum)
Follow-Up Visit   Subjective  Bruce Garza is a 76 y.o. 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 The patient presents for Total-Body Skin Exam (TBSE) for skin cancer screening and mole check.  The patient has spots, moles and lesions to be evaluated, some may be new or changing and the patient has concerns that these could be cancer.   The following portions of the chart were reviewed this encounter and updated as appropriate:   Tobacco  Allergies  Meds  Problems  Med Hx  Surg Hx  Fam Hx     Review of Systems:  No other skin or systemic complaints except as noted in HPI or Assessment and Plan.  Objective  Well appearing patient in no apparent distress; mood and affect are within normal limits.  A full examination was performed including scalp, head, eyes, ears, nose, lips, neck, chest, axillae, abdomen, back, buttocks, bilateral upper extremities, bilateral lower extremities, hands, feet, fingers, toes, fingernails, and toenails. All findings within normal limits unless otherwise noted below.  R volar index finger 2.1cm soft pap with central verrucous patch   Assessment & Plan   Lentigines - Scattered tan macules - Due to sun exposure - Benign-appearing, observe - Recommend daily broad spectrum sunscreen SPF 30+ to sun-exposed areas, reapply every 2 hours as needed. - Call for any changes  Seborrheic Keratoses - Stuck-on, waxy, tan-brown papules and/or plaques  - Benign-appearing - Discussed benign etiology and prognosis. - Observe - Call for any changes  Melanocytic Nevi - Tan-brown and/or pink-flesh-colored symmetric macules and papules - Benign appearing on exam today - Observation - Call clinic for new or changing moles - Recommend daily use of broad spectrum spf 30+ sunscreen to  sun-exposed areas.   Hemangiomas - Red papules - Discussed benign nature - Observe - Call for any changes  Actinic Damage - Chronic condition, secondary to cumulative UV/sun exposure - diffuse scaly erythematous macules with underlying dyspigmentation - Recommend daily broad spectrum sunscreen SPF 30+ to sun-exposed areas, reapply every 2 hours as needed.  - Staying in the shade or wearing long sleeves, sun glasses (UVA+UVB protection) and wide brim hats (4-inch brim around the entire circumference of the hat) are also recommended for sun protection.  - Call for new or changing lesions.  Skin cancer screening performed today.  History of Basal Cell Carcinoma of the Skin - No evidence of recurrence today - Recommend regular full body skin exams - Recommend daily broad spectrum sunscreen SPF 30+ to sun-exposed areas, reapply every 2 hours as needed.  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  Previous treatments include LN2, topical agents, intralesional Candida antigen immunotherapy for wart.  Discussed viral etiology and risk of spread.  Discussed multiple treatments may be required to clear warts.  Discussed possible post-treatment dyspigmentation and risk of recurrence.  Previously recommended referral to Dr. Claudia Desanctis - hand surgeon/plastic surgeon and pt declined, mentioned today pt still declines  Observe - Pt does not want any treatment.  Its asymptomatic.  Skin cancer screening  Return in about 1 year (around 03/09/2023) for TBSE, Hx of BCC.  I,  Othelia Pulling, RMA, am acting as scribe for Sarina Ser, MD . Documentation: I have reviewed the above documentation for accuracy and completeness, and I agree with the above.  Sarina Ser, MD

## 2022-03-08 NOTE — Patient Instructions (Signed)

## 2022-03-12 ENCOUNTER — Encounter: Payer: Self-pay | Admitting: Dermatology

## 2022-03-27 DIAGNOSIS — I10 Essential (primary) hypertension: Secondary | ICD-10-CM | POA: Diagnosis not present

## 2022-03-27 DIAGNOSIS — N1831 Chronic kidney disease, stage 3a: Secondary | ICD-10-CM | POA: Diagnosis not present

## 2022-03-27 DIAGNOSIS — R7303 Prediabetes: Secondary | ICD-10-CM | POA: Diagnosis not present

## 2022-03-27 DIAGNOSIS — E782 Mixed hyperlipidemia: Secondary | ICD-10-CM | POA: Diagnosis not present

## 2022-04-03 DIAGNOSIS — N1831 Chronic kidney disease, stage 3a: Secondary | ICD-10-CM | POA: Diagnosis not present

## 2022-04-03 DIAGNOSIS — H2513 Age-related nuclear cataract, bilateral: Secondary | ICD-10-CM | POA: Diagnosis not present

## 2022-04-03 DIAGNOSIS — R7303 Prediabetes: Secondary | ICD-10-CM | POA: Diagnosis not present

## 2022-04-03 DIAGNOSIS — G4733 Obstructive sleep apnea (adult) (pediatric): Secondary | ICD-10-CM | POA: Diagnosis not present

## 2022-04-03 DIAGNOSIS — E782 Mixed hyperlipidemia: Secondary | ICD-10-CM | POA: Diagnosis not present

## 2022-04-03 DIAGNOSIS — H353132 Nonexudative age-related macular degeneration, bilateral, intermediate dry stage: Secondary | ICD-10-CM | POA: Diagnosis not present

## 2022-04-03 DIAGNOSIS — D472 Monoclonal gammopathy: Secondary | ICD-10-CM | POA: Diagnosis not present

## 2022-04-03 DIAGNOSIS — H35371 Puckering of macula, right eye: Secondary | ICD-10-CM | POA: Diagnosis not present

## 2022-04-03 DIAGNOSIS — H401133 Primary open-angle glaucoma, bilateral, severe stage: Secondary | ICD-10-CM | POA: Diagnosis not present

## 2022-04-03 DIAGNOSIS — I129 Hypertensive chronic kidney disease with stage 1 through stage 4 chronic kidney disease, or unspecified chronic kidney disease: Secondary | ICD-10-CM | POA: Diagnosis not present

## 2022-04-03 DIAGNOSIS — Z0000 Encounter for general adult medical examination without abnormal findings: Secondary | ICD-10-CM | POA: Diagnosis not present

## 2022-04-06 ENCOUNTER — Inpatient Hospital Stay: Payer: Medicare HMO | Attending: Oncology

## 2022-04-06 DIAGNOSIS — D472 Monoclonal gammopathy: Secondary | ICD-10-CM | POA: Diagnosis not present

## 2022-04-06 DIAGNOSIS — Z79899 Other long term (current) drug therapy: Secondary | ICD-10-CM | POA: Diagnosis not present

## 2022-04-06 LAB — COMPREHENSIVE METABOLIC PANEL
ALT: 22 U/L (ref 0–44)
AST: 23 U/L (ref 15–41)
Albumin: 4.3 g/dL (ref 3.5–5.0)
Alkaline Phosphatase: 57 U/L (ref 38–126)
Anion gap: 8 (ref 5–15)
BUN: 26 mg/dL — ABNORMAL HIGH (ref 8–23)
CO2: 25 mmol/L (ref 22–32)
Calcium: 9 mg/dL (ref 8.9–10.3)
Chloride: 103 mmol/L (ref 98–111)
Creatinine, Ser: 1.65 mg/dL — ABNORMAL HIGH (ref 0.61–1.24)
GFR, Estimated: 43 mL/min — ABNORMAL LOW (ref >60)
Glucose, Bld: 134 mg/dL — ABNORMAL HIGH (ref 70–99)
Potassium: 4.6 mmol/L (ref 3.5–5.1)
Sodium: 136 mmol/L (ref 135–145)
Total Bilirubin: 1.4 mg/dL — ABNORMAL HIGH (ref 0.3–1.2)
Total Protein: 7.2 g/dL (ref 6.5–8.1)

## 2022-04-06 LAB — CBC WITH DIFFERENTIAL/PLATELET
Abs Immature Granulocytes: 0.01 10*3/uL (ref 0.00–0.07)
Basophils Absolute: 0 10*3/uL (ref 0.0–0.1)
Basophils Relative: 0 %
Eosinophils Absolute: 0.4 10*3/uL (ref 0.0–0.5)
Eosinophils Relative: 6 %
HCT: 41.4 % (ref 39.0–52.0)
Hemoglobin: 14.1 g/dL (ref 13.0–17.0)
Immature Granulocytes: 0 %
Lymphocytes Relative: 24 %
Lymphs Abs: 1.9 10*3/uL (ref 0.7–4.0)
MCH: 33.7 pg (ref 26.0–34.0)
MCHC: 34.1 g/dL (ref 30.0–36.0)
MCV: 99 fL (ref 80.0–100.0)
Monocytes Absolute: 0.5 10*3/uL (ref 0.1–1.0)
Monocytes Relative: 7 %
Neutro Abs: 4.9 10*3/uL (ref 1.7–7.7)
Neutrophils Relative %: 63 %
Platelets: 147 10*3/uL — ABNORMAL LOW (ref 150–400)
RBC: 4.18 MIL/uL — ABNORMAL LOW (ref 4.22–5.81)
RDW: 12.2 % (ref 11.5–15.5)
WBC: 7.7 10*3/uL (ref 4.0–10.5)
nRBC: 0 % (ref 0.0–0.2)

## 2022-04-09 LAB — KAPPA/LAMBDA LIGHT CHAINS
Kappa free light chain: 73.4 mg/L — ABNORMAL HIGH (ref 3.3–19.4)
Kappa, lambda light chain ratio: 5.24 — ABNORMAL HIGH (ref 0.26–1.65)
Lambda free light chains: 14 mg/L (ref 5.7–26.3)

## 2022-04-11 LAB — MULTIPLE MYELOMA PANEL, SERUM
Albumin SerPl Elph-Mcnc: 4 g/dL (ref 2.9–4.4)
Albumin/Glob SerPl: 1.5 (ref 0.7–1.7)
Alpha 1: 0.2 g/dL (ref 0.0–0.4)
Alpha2 Glob SerPl Elph-Mcnc: 0.6 g/dL (ref 0.4–1.0)
B-Globulin SerPl Elph-Mcnc: 1.2 g/dL (ref 0.7–1.3)
Gamma Glob SerPl Elph-Mcnc: 0.7 g/dL (ref 0.4–1.8)
Globulin, Total: 2.7 g/dL (ref 2.2–3.9)
IgA: 389 mg/dL (ref 61–437)
IgG (Immunoglobin G), Serum: 687 mg/dL (ref 603–1613)
IgM (Immunoglobulin M), Srm: 49 mg/dL (ref 15–143)
M Protein SerPl Elph-Mcnc: 0.2 g/dL — ABNORMAL HIGH
Total Protein ELP: 6.7 g/dL (ref 6.0–8.5)

## 2022-05-09 DIAGNOSIS — H2511 Age-related nuclear cataract, right eye: Secondary | ICD-10-CM | POA: Diagnosis not present

## 2022-05-17 ENCOUNTER — Encounter: Payer: Self-pay | Admitting: Ophthalmology

## 2022-05-22 NOTE — Discharge Instructions (Signed)

## 2022-05-22 NOTE — Anesthesia Preprocedure Evaluation (Deleted)
Anesthesia Evaluation  Patient identified by MRN, date of birth, ID band Patient awake    Reviewed: Allergy & Precautions, NPO status , Patient's Chart, lab work & pertinent test results  History of Anesthesia Complications Negative for: history of anesthetic complications  Airway Mallampati: III  TM Distance: >3 FB Neck ROM: full    Dental  (+) Lower Dentures, Upper Dentures   Pulmonary sleep apnea and Continuous Positive Airway Pressure Ventilation ,    Pulmonary exam normal        Cardiovascular hypertension, Normal cardiovascular exam     Neuro/Psych negative neurological ROS  negative psych ROS   GI/Hepatic negative GI ROS, Neg liver ROS,   Endo/Other  negative endocrine ROS  Renal/GU CRFRenal disease     Musculoskeletal   Abdominal   Peds  Hematology negative hematology ROS (+) MGUS (monoclonal gammopathy of unknown significance   Anesthesia Other Findings Past Medical History: 07/04/2020: Basal cell carcinoma     Comment:  left nasal bridge/L med canthus/lower eyelid. Excised               09/06/2020, margins free. No date: Elevated serum immunoglobulin free light chains No date: History of shingles No date: Hyperlipidemia No date: Hypertension No date: MGUS (monoclonal gammopathy of unknown significance) No date: Sleep apnea     Comment:  CPAP No date: Wears dentures     Comment:  full upper and lower  Past Surgical History: 07/22/2015: COLONOSCOPY WITH PROPOFOL; N/A     Comment:  Procedure: COLONOSCOPY WITH PROPOFOL;  Surgeon: Lollie Sails, MD;  Location: [facility] ENDOSCOPY;  Service:               Endoscopy;  Laterality: N/A; No date: excision ganglion cyst wrist  BMI    Body Mass Index: 30.67 kg/m      Reproductive/Obstetrics negative OB ROS                             Anesthesia Physical Anesthesia Plan  ASA: 2  Anesthesia Plan: MAC   Post-op  Pain Management:    Induction:   PONV Risk Score and Plan:   Airway Management Planned:   Additional Equipment:   Intra-op Plan:   Post-operative Plan:   Informed Consent:   Plan Discussed with: CRNA  Anesthesia Plan Comments:         Anesthesia Quick Evaluation

## 2022-06-06 ENCOUNTER — Ambulatory Visit
Admission: RE | Admit: 2022-06-06 | Discharge: 2022-06-06 | Disposition: A | Discharge status: Home / Self Care | Payer: Medicare HMO | Attending: Ophthalmology | Admitting: Ophthalmology

## 2022-06-06 ENCOUNTER — Ambulatory Visit: Payer: Medicare HMO | Admitting: Anesthesiology

## 2022-06-06 ENCOUNTER — Ambulatory Visit (AMBULATORY_SURGERY_CENTER): Payer: Medicare HMO | Admitting: Anesthesiology

## 2022-06-06 ENCOUNTER — Other Ambulatory Visit: Payer: Self-pay

## 2022-06-06 ENCOUNTER — Encounter: Payer: Self-pay | Admitting: Ophthalmology

## 2022-06-06 ENCOUNTER — Encounter
Admission: RE | Disposition: A | Discharge status: Home / Self Care | Payer: Self-pay | Source: Home / Self Care | Attending: Ophthalmology

## 2022-06-06 DIAGNOSIS — I10 Essential (primary) hypertension: Secondary | ICD-10-CM | POA: Insufficient documentation

## 2022-06-06 DIAGNOSIS — Z9989 Dependence on other enabling machines and devices: Secondary | ICD-10-CM | POA: Diagnosis not present

## 2022-06-06 DIAGNOSIS — H2511 Age-related nuclear cataract, right eye: Secondary | ICD-10-CM | POA: Diagnosis not present

## 2022-06-06 DIAGNOSIS — G4733 Obstructive sleep apnea (adult) (pediatric): Secondary | ICD-10-CM | POA: Diagnosis not present

## 2022-06-06 DIAGNOSIS — E119 Type 2 diabetes mellitus without complications: Secondary | ICD-10-CM | POA: Diagnosis not present

## 2022-06-06 DIAGNOSIS — G4730 Sleep apnea, unspecified: Secondary | ICD-10-CM | POA: Insufficient documentation

## 2022-06-06 HISTORY — DX: Presence of dental prosthetic device (complete) (partial): Z97.2

## 2022-06-06 HISTORY — PX: CATARACT EXTRACTION W/PHACO: SHX586

## 2022-06-06 HISTORY — DX: Sleep apnea, unspecified: G47.30

## 2022-06-06 SURGERY — PHACOEMULSIFICATION, CATARACT, WITH IOL INSERTION
Anesthesia: Monitor Anesthesia Care | Site: Eye | Laterality: Right

## 2022-06-06 MED ORDER — SIGHTPATH DOSE#1 BSS IO SOLN
INTRAOCULAR | Status: DC | PRN
  Administered 2022-06-06: 15 mL

## 2022-06-06 MED ORDER — FENTANYL CITRATE (PF) 100 MCG/2ML IJ SOLN
INTRAMUSCULAR | Status: DC | PRN
Start: 2022-06-06 — End: 2022-06-06
  Administered 2022-06-06: 50 ug via INTRAVENOUS

## 2022-06-06 MED ORDER — LACTATED RINGERS IV SOLN: INTRAVENOUS | Status: DC

## 2022-06-06 MED ORDER — ARMC OPHTHALMIC DILATING DROPS
1.0000 | OPHTHALMIC | Status: DC | PRN
  Administered 2022-06-06 (×3): 1 via OPHTHALMIC

## 2022-06-06 MED ORDER — MIDAZOLAM HCL 2 MG/2ML IJ SOLN
INTRAMUSCULAR | Status: DC | PRN
  Administered 2022-06-06: 1 mg via INTRAVENOUS

## 2022-06-06 MED ORDER — TETRACAINE HCL 0.5 % OP SOLN
1.0000 [drp] | OPHTHALMIC | Status: DC | PRN
  Administered 2022-06-06 (×3): 1 [drp] via OPHTHALMIC

## 2022-06-06 MED ORDER — CEFUROXIME OPHTHALMIC INJECTION 1 MG/0.1 ML
INJECTION | OPHTHALMIC | Status: DC | PRN
  Administered 2022-06-06: 0.1 mL via INTRACAMERAL

## 2022-06-06 MED ORDER — SIGHTPATH DOSE#1 NA HYALUR & NA CHOND-NA HYALUR IO KIT
PACK | INTRAOCULAR | Status: DC | PRN
  Administered 2022-06-06: 1 via OPHTHALMIC

## 2022-06-06 MED ORDER — SIGHTPATH DOSE#1 BSS IO SOLN
INTRAOCULAR | Status: DC | PRN
  Administered 2022-06-06: 1 mL via INTRAMUSCULAR

## 2022-06-06 MED ORDER — BRIMONIDINE TARTRATE-TIMOLOL 0.2-0.5 % OP SOLN
OPHTHALMIC | Status: DC | PRN
  Administered 2022-06-06: 1 [drp] via OPHTHALMIC

## 2022-06-06 MED ORDER — SIGHTPATH DOSE#1 BSS IO SOLN
INTRAOCULAR | Status: DC | PRN
  Administered 2022-06-06: 71 mL via OPHTHALMIC

## 2022-06-06 SURGICAL SUPPLY — 12 items
CATARACT SUITE SIGHTPATH (MISCELLANEOUS) ×1 IMPLANT
FEE CATARACT SUITE SIGHTPATH (MISCELLANEOUS) ×1 IMPLANT
GLOVE SRG 8 PF TXTR STRL LF DI (GLOVE) ×1 IMPLANT
GLOVE SURG ENC TEXT LTX SZ7.5 (GLOVE) ×1 IMPLANT
GLOVE SURG UNDER POLY LF SZ8 (GLOVE) ×1
LENS IOL DIOP 21.0 (Intraocular Lens) ×1 IMPLANT
LENS IOL TECNIS MONO 21.0 (Intraocular Lens) IMPLANT
NDL FILTER BLUNT 18X1 1/2 (NEEDLE) ×1 IMPLANT
NEEDLE FILTER BLUNT 18X 1/2SAF (NEEDLE) ×1
NEEDLE FILTER BLUNT 18X1 1/2 (NEEDLE) ×1 IMPLANT
SYR 3ML LL SCALE MARK (SYRINGE) ×1 IMPLANT
WATER STERILE IRR 250ML POUR (IV SOLUTION) ×1 IMPLANT

## 2022-06-06 NOTE — Op Note (Signed)
  LOCATION:  [place]   PREOPERATIVE DIAGNOSIS:    Nuclear sclerotic cataract right eye. H25.11   POSTOPERATIVE DIAGNOSIS:  Nuclear sclerotic cataract right eye.     PROCEDURE:  Phacoemusification with posterior chamber intraocular lens placement of the right eye   ULTRASOUND TIME: Procedure(s) with comments: CATARACT EXTRACTION PHACO AND INTRAOCULAR LENS PLACEMENT (IOC) RIGHT DIABETIC 7.60 01:04.5 (Right) - sleep apnea  LENS:   Implant Name Type Inv. Item Serial No. Manufacturer Lot No. LRB No. Used Action  LENS IOL DIOP 21.0 - [id number] Intraocular Lens LENS IOL DIOP 21.0 [id number] [employer]  Right 1 Implanted         SURGEON:  Wyonia Hough, MD   ANESTHESIA:  Topical with tetracaine drops and 2% Xylocaine jelly, augmented with 1% preservative-free intracameral lidocaine.    COMPLICATIONS:  None.   DESCRIPTION OF PROCEDURE:  The patient was identified in the holding room and transported to the operating room and placed in the supine position under the operating microscope.  The right eye was identified as the operative eye and it was prepped and draped in the usual sterile ophthalmic fashion.   A 1 millimeter clear-corneal paracentesis was made at the 12:00 position.  0.5 ml of preservative-free 1% lidocaine was injected into the anterior chamber. The anterior chamber was filled with Viscoat viscoelastic.  A 2.4 millimeter keratome was used to make a near-clear corneal incision at the 9:00 position.  A curvilinear capsulorrhexis was made with a cystotome and capsulorrhexis forceps.  Balanced salt solution was used to hydrodissect and hydrodelineate the nucleus.   Phacoemulsification was then used in stop and chop fashion to remove the lens nucleus and epinucleus.  The remaining cortex was then removed using the irrigation and aspiration handpiece. Provisc was then placed into the capsular bag to distend it for lens placement.  A lens was then injected into the  capsular bag.  The remaining viscoelastic was aspirated.   Wounds were hydrated with balanced salt solution.  The anterior chamber was inflated to a physiologic pressure with balanced salt solution.  No wound leaks were noted. Cefuroxime 0.1 ml of a '10mg'$ /ml solution was injected into the anterior chamber for a dose of 1 mg of intracameral antibiotic at the completion of the case.   Timolol and Brimonidine drops were applied to the eye.  The patient was taken to the recovery room in stable condition without complications of anesthesia or surgery.   Bruce Garza 06/06/2022, 8:28 AM

## 2022-06-06 NOTE — Anesthesia Postprocedure Evaluation (Signed)
Anesthesia Post Note  Patient: Bruce Garza  Procedure(s) Performed: CATARACT EXTRACTION PHACO AND INTRAOCULAR LENS PLACEMENT (IOC) RIGHT DIABETIC 7.60 01:04.5 (Right: Eye)     Patient location during evaluation: PACU Anesthesia Type: MAC Level of consciousness: awake and alert Pain management: pain level controlled Vital Signs Assessment: post-procedure vital signs reviewed and stable Respiratory status: spontaneous breathing, nonlabored ventilation, respiratory function stable and patient connected to nasal cannula oxygen Cardiovascular status: stable and blood pressure returned to baseline Postop Assessment: no apparent nausea or vomiting Anesthetic complications: no   No notable events documented.  Martha Clan

## 2022-06-06 NOTE — H&P (Signed)
[facility]   Primary Care Physician:  Marinda Elk, MD Ophthalmologist: Dr. Leandrew Koyanagi  Pre-Procedure History & Physical: HPI:  Bruce Garza is a 76 y.o. male here for ophthalmic surgery.   Past Medical History:  Diagnosis Date   Basal cell carcinoma 07/04/2020   left nasal bridge/L med canthus/lower eyelid. Excised 09/06/2020, margins free.   Elevated serum immunoglobulin free light chains    History of shingles    Hyperlipidemia    Hypertension    MGUS (monoclonal gammopathy of unknown significance)    Sleep apnea    CPAP   Wears dentures    full upper and lower    Past Surgical History:  Procedure Laterality Date   COLONOSCOPY WITH PROPOFOL N/A 07/22/2015   Procedure: COLONOSCOPY WITH PROPOFOL;  Surgeon: Lollie Sails, MD;  Location: [facility] ENDOSCOPY;  Service: Endoscopy;  Laterality: N/A;   excision ganglion cyst wrist      Prior to Admission medications   Medication Sig Start Date End Date Taking? Authorizing Provider  amLODipine (NORVASC) 10 MG tablet Take 5 mg by mouth daily. 09/03/19  Yes [provider]  ascorbic acid (VITAMIN C) 1000 MG tablet Take 1,000 mg by mouth daily.   Yes [provider]  Cholecalciferol (VITAMIN D3) 25 MCG (1000 UT) CAPS Take by mouth.   Yes [provider]  dorzolamide-timolol (COSOPT) 22.3-6.8 MG/ML ophthalmic solution Apply to eye 2 (two) times daily. 02/28/15  Yes [provider]  ketorolac (ACULAR) 0.5 % ophthalmic solution Apply 1 drop into right eye four times a day  for 5 days 09/23/20  Yes [provider]  latanoprost (XALATAN) 0.005 % ophthalmic solution Apply to eye.   Yes [provider]  latanoprost (XALATAN) 0.005 % ophthalmic solution Place into both eyes at bedtime.   Yes [provider]  losartan (COZAAR) 25 MG tablet Take 1 tablet by mouth every evening. 10/31/21 10/31/22 Yes [provider]  MODERNA COVID-19 VACCINE 100  MCG/0.5ML injection  11/24/20  Yes [provider]  Multiple Vitamins-Minerals (OCUVITE ADULT 50+ PO) Take 1 capsule by mouth in the morning and at bedtime.    Yes [provider]  prednisoLONE acetate (PRED FORTE) 1 % ophthalmic suspension 1 drop in the morning and at bedtime.    Yes [provider]  rosuvastatin (CRESTOR) 5 MG tablet  09/26/20  Yes [provider]  spironolactone (ALDACTONE) 25 MG tablet Take 25 mg by mouth 2 (two) times daily.   Yes [provider]    Allergies as of 04/10/2022 - Review Complete 03/12/2022  Allergen Reaction Noted   Lisinopril Swelling and Other (See Comments) 11/21/2017    History reviewed. No pertinent family history.  Social History   Socioeconomic History   Marital status: Married    Spouse name: pam   Number of children: Not on file   Years of education: Not on file   Highest education level: Not on file  Occupational History   Not on file  Tobacco Use   Smoking status: Never   Smokeless tobacco: Never  Vaping Use   Vaping Use: Never used  Substance and Sexual Activity   Alcohol use: Never   Drug use: Never   Sexual activity: Not on file  Other Topics Concern   Not on file  Social History Narrative   Not on file   Social Determinants of Health   Financial Resource Strain: Not on file  Food Insecurity: Not on file  Transportation  Needs: Not on file  Physical Activity: Not on file  Stress: Not on file  Social Connections: Not on file  Intimate Partner Violence: Not on file    Review of Systems: See HPI, otherwise negative ROS  Physical Exam: BP 124/63   Pulse (!) 56   Temp 97.8 F (36.6 C) (Temporal)   Resp 20   Ht '5\' 6"'$  (1.676 m)   Wt 83 kg   SpO2 95%   BMI 29.54 kg/m  General:   Alert,  pleasant and cooperative in NAD Head:  Normocephalic and atraumatic. Lungs:  Clear to auscultation.    Heart:  Regular rate and rhythm.   Impression/Plan: Bruce Garza is here  for ophthalmic surgery.  Risks, benefits, limitations, and alternatives regarding ophthalmic surgery have been reviewed with the patient.  Questions have been answered.  All parties agreeable.   Leandrew Koyanagi, MD  06/06/2022, 7:37 AM

## 2022-06-06 NOTE — Anesthesia Preprocedure Evaluation (Signed)
Anesthesia Evaluation  Patient identified by MRN, date of birth, ID band Patient awake    Reviewed: Allergy & Precautions, H&P , NPO status , Patient's Chart, lab work & pertinent test results  History of Anesthesia Complications Negative for: history of anesthetic complications  Airway Mallampati: III  TM Distance: >3 FB Neck ROM: limited    Dental  (+) Poor Dentition, Missing, Upper Dentures, Lower Dentures   Pulmonary neg shortness of breath, sleep apnea and Continuous Positive Airway Pressure Ventilation , neg recent URI,    Pulmonary exam normal breath sounds clear to auscultation       Cardiovascular Exercise Tolerance: Good hypertension, (-) angina(-) Past MI and (-) Cardiac Stents Normal cardiovascular exam(-) dysrhythmias (-) Valvular Problems/Murmurs Rhythm:regular Rate:Normal     Neuro/Psych negative neurological ROS  negative psych ROS   GI/Hepatic negative GI ROS, Neg liver ROS, neg GERD  ,  Endo/Other  negative endocrine ROS  Renal/GU negative Renal ROS  negative genitourinary   Musculoskeletal   Abdominal   Peds  Hematology negative hematology ROS (+)   Anesthesia Other Findings Past Medical History:   Hypertension                                                 Hyperlipidemia                                               History of shingles                                         Signs and symptoms suggestive of sleep apnea   Reproductive/Obstetrics negative OB ROS                             Anesthesia Physical  Anesthesia Plan  ASA: 3  Anesthesia Plan: MAC   Post-op Pain Management:    Induction: Intravenous  PONV Risk Score and Plan: 1 and Midazolam and Treatment may vary due to age or medical condition  Airway Management Planned: Natural Airway and Nasal Cannula  Additional Equipment:   Intra-op Plan:   Post-operative Plan:   Informed Consent: I have  reviewed the patients History and Physical, chart, labs and discussed the procedure including the risks, benefits and alternatives for the proposed anesthesia with the patient or authorized representative who has indicated his/her understanding and acceptance.     Dental Advisory Given  Plan Discussed with: Anesthesiologist, CRNA and Surgeon  Anesthesia Plan Comments:         Anesthesia Quick Evaluation

## 2022-06-06 NOTE — Transfer of Care (Signed)
Immediate Anesthesia Transfer of Care Note  Patient: Bruce Garza  Procedure(s) Performed: CATARACT EXTRACTION PHACO AND INTRAOCULAR LENS PLACEMENT (IOC) RIGHT DIABETIC 7.60 01:04.5 (Right: Eye)  Patient Location: PACU  Anesthesia Type: MAC  Level of Consciousness: awake, alert  and patient cooperative  Airway and Oxygen Therapy: Patient Spontanous Breathing and Patient connected to supplemental oxygen  Post-op Assessment: Post-op Vital signs reviewed, Patient's Cardiovascular Status Stable, Respiratory Function Stable, Patent Airway and No signs of Nausea or vomiting  Post-op Vital Signs: Reviewed and stable  Complications: No notable events documented.

## 2022-06-07 ENCOUNTER — Encounter: Payer: Self-pay | Admitting: Ophthalmology

## 2022-06-14 DIAGNOSIS — N1831 Chronic kidney disease, stage 3a: Secondary | ICD-10-CM | POA: Diagnosis not present

## 2022-06-19 DIAGNOSIS — D472 Monoclonal gammopathy: Secondary | ICD-10-CM | POA: Diagnosis not present

## 2022-06-19 DIAGNOSIS — N1832 Chronic kidney disease, stage 3b: Secondary | ICD-10-CM | POA: Diagnosis not present

## 2022-06-19 DIAGNOSIS — I10 Essential (primary) hypertension: Secondary | ICD-10-CM | POA: Diagnosis not present

## 2022-06-19 NOTE — Discharge Instructions (Signed)

## 2022-06-20 ENCOUNTER — Ambulatory Visit: Payer: Medicare HMO | Admitting: Anesthesiology

## 2022-06-20 ENCOUNTER — Ambulatory Visit
Admission: RE | Admit: 2022-06-20 | Discharge: 2022-06-20 | Disposition: A | Discharge status: Home / Self Care | Payer: Medicare HMO | Attending: Ophthalmology | Admitting: Ophthalmology

## 2022-06-20 ENCOUNTER — Other Ambulatory Visit: Payer: Self-pay

## 2022-06-20 ENCOUNTER — Ambulatory Visit (AMBULATORY_SURGERY_CENTER): Payer: Medicare HMO | Admitting: Anesthesiology

## 2022-06-20 ENCOUNTER — Encounter
Admission: RE | Disposition: A | Discharge status: Home / Self Care | Payer: Self-pay | Source: Home / Self Care | Attending: Ophthalmology

## 2022-06-20 DIAGNOSIS — I10 Essential (primary) hypertension: Secondary | ICD-10-CM | POA: Insufficient documentation

## 2022-06-20 DIAGNOSIS — E1136 Type 2 diabetes mellitus with diabetic cataract: Secondary | ICD-10-CM | POA: Insufficient documentation

## 2022-06-20 DIAGNOSIS — H2512 Age-related nuclear cataract, left eye: Secondary | ICD-10-CM | POA: Insufficient documentation

## 2022-06-20 DIAGNOSIS — Z85828 Personal history of other malignant neoplasm of skin: Secondary | ICD-10-CM | POA: Diagnosis not present

## 2022-06-20 DIAGNOSIS — G4730 Sleep apnea, unspecified: Secondary | ICD-10-CM | POA: Diagnosis not present

## 2022-06-20 HISTORY — PX: CATARACT EXTRACTION W/PHACO: SHX586

## 2022-06-20 SURGERY — PHACOEMULSIFICATION, CATARACT, WITH IOL INSERTION
Anesthesia: Monitor Anesthesia Care | Site: Eye | Laterality: Left

## 2022-06-20 MED ORDER — SIGHTPATH DOSE#1 BSS IO SOLN
INTRAOCULAR | Status: DC | PRN
  Administered 2022-06-20: 15 mL via INTRAOCULAR

## 2022-06-20 MED ORDER — MIDAZOLAM HCL 2 MG/2ML IJ SOLN
INTRAMUSCULAR | Status: DC | PRN
  Administered 2022-06-20: 1 mg via INTRAVENOUS

## 2022-06-20 MED ORDER — BRIMONIDINE TARTRATE-TIMOLOL 0.2-0.5 % OP SOLN
OPHTHALMIC | Status: DC | PRN
  Administered 2022-06-20: 1 [drp] via OPHTHALMIC

## 2022-06-20 MED ORDER — CEFUROXIME OPHTHALMIC INJECTION 1 MG/0.1 ML
INJECTION | OPHTHALMIC | Status: DC | PRN
  Administered 2022-06-20: 0.1 mL via INTRACAMERAL

## 2022-06-20 MED ORDER — SIGHTPATH DOSE#1 BSS IO SOLN
INTRAOCULAR | Status: DC | PRN
  Administered 2022-06-20: 2 mL

## 2022-06-20 MED ORDER — SIGHTPATH DOSE#1 NA HYALUR & NA CHOND-NA HYALUR IO KIT
PACK | INTRAOCULAR | Status: DC | PRN
  Administered 2022-06-20: 1 via OPHTHALMIC

## 2022-06-20 MED ORDER — ARMC OPHTHALMIC DILATING DROPS
1.0000 | OPHTHALMIC | Status: AC | PRN
  Administered 2022-06-20 (×3): 1 via OPHTHALMIC

## 2022-06-20 MED ORDER — FENTANYL CITRATE (PF) 100 MCG/2ML IJ SOLN
INTRAMUSCULAR | Status: DC | PRN
  Administered 2022-06-20: 50 ug via INTRAVENOUS

## 2022-06-20 MED ORDER — SIGHTPATH DOSE#1 BSS IO SOLN
INTRAOCULAR | Status: DC | PRN
  Administered 2022-06-20: 64 mL via OPHTHALMIC

## 2022-06-20 MED ORDER — TETRACAINE HCL 0.5 % OP SOLN
1.0000 [drp] | OPHTHALMIC | Status: AC | PRN
  Administered 2022-06-20 (×3): 1 [drp] via OPHTHALMIC

## 2022-06-20 SURGICAL SUPPLY — 14 items
CANNULA ANT/CHMB 27G (MISCELLANEOUS) IMPLANT
CANNULA ANT/CHMB 27GA (MISCELLANEOUS) IMPLANT
CATARACT SUITE SIGHTPATH (MISCELLANEOUS) ×1 IMPLANT
FEE CATARACT SUITE SIGHTPATH (MISCELLANEOUS) ×1 IMPLANT
GLOVE SRG 8 PF TXTR STRL LF DI (GLOVE) ×1 IMPLANT
GLOVE SURG ENC TEXT LTX SZ7.5 (GLOVE) ×1 IMPLANT
GLOVE SURG UNDER POLY LF SZ8 (GLOVE) ×1
LENS IOL DIOP 20.0 (Intraocular Lens) ×1 IMPLANT
LENS IOL TECNIS MONO 20.0 (Intraocular Lens) IMPLANT
NDL FILTER BLUNT 18X1 1/2 (NEEDLE) ×1 IMPLANT
NEEDLE FILTER BLUNT 18X 1/2SAF (NEEDLE) ×1
NEEDLE FILTER BLUNT 18X1 1/2 (NEEDLE) ×1 IMPLANT
SYR 3ML LL SCALE MARK (SYRINGE) ×1 IMPLANT
WATER STERILE IRR 250ML POUR (IV SOLUTION) ×1 IMPLANT

## 2022-06-20 NOTE — Transfer of Care (Signed)
Immediate Anesthesia Transfer of Care Note  Patient: Bruce Garza  Procedure(s) Performed: CATARACT EXTRACTION PHACO AND INTRAOCULAR LENS PLACEMENT ([facility]) LEFT DIABETIC 6.74 00:49.2  (Left: Eye)  Patient Location: PACU  Anesthesia Type: No value filed.  Level of Consciousness: awake, alert  and patient cooperative  Airway and Oxygen Therapy: Patient Spontanous Breathing and Patient connected to supplemental oxygen  Post-op Assessment: Post-op Vital signs reviewed, Patient's Cardiovascular Status Stable, Respiratory Function Stable, Patent Airway and No signs of Nausea or vomiting  Post-op Vital Signs: Reviewed and stable  Complications: There were no known notable events for this encounter.

## 2022-06-20 NOTE — Anesthesia Preprocedure Evaluation (Signed)
Anesthesia Evaluation  Patient identified by MRN, date of birth, ID band Patient awake    Reviewed: Allergy & Precautions, NPO status , Patient's Chart, lab work & pertinent test results  History of Anesthesia Complications Negative for: history of anesthetic complications  Airway Mallampati: II  TM Distance: >3 FB Neck ROM: Full    Dental  (+) Teeth Intact   Pulmonary sleep apnea , neg COPD, Patient abstained from smoking.Not current smoker,    Pulmonary exam normal breath sounds clear to auscultation       Cardiovascular Exercise Tolerance: Good METShypertension, (-) CAD and (-) Past MI (-) dysrhythmias  Rhythm:Regular Rate:Normal - Systolic murmurs    Neuro/Psych negative neurological ROS  negative psych ROS   GI/Hepatic neg GERD  ,(+)     (-) substance abuse  ,   Endo/Other  neg diabetes  Renal/GU negative Renal ROS     Musculoskeletal   Abdominal   Peds  Hematology   Anesthesia Other Findings Past Medical History: 07/04/2020: Basal cell carcinoma     Comment:  left nasal bridge/L med canthus/lower eyelid. Excised               09/06/2020, margins free. No date: Elevated serum immunoglobulin free light chains No date: History of shingles No date: Hyperlipidemia No date: Hypertension No date: MGUS (monoclonal gammopathy of unknown significance) No date: Sleep apnea     Comment:  CPAP No date: Wears dentures     Comment:  full upper and lower  Reproductive/Obstetrics                             Anesthesia Physical Anesthesia Plan  ASA: 3  Anesthesia Plan: MAC   Post-op Pain Management:    Induction: Intravenous  PONV Risk Score and Plan: 1 and Midazolam  Airway Management Planned: Nasal Cannula  Additional Equipment:   Intra-op Plan:   Post-operative Plan:   Informed Consent: I have reviewed the patients History and Physical, chart, labs and discussed the  procedure including the risks, benefits and alternatives for the proposed anesthesia with the patient or authorized representative who has indicated his/her understanding and acceptance.       Plan Discussed with: CRNA and Surgeon  Anesthesia Plan Comments: (Explained risks of anesthesia, including PONV, and rare emergencies such as cardiac events, respiratory problems, and allergic reactions, requiring invasive intervention. Discussed the role of CRNA in patient's perioperative care. Patient understands. )        Anesthesia Quick Evaluation

## 2022-06-20 NOTE — H&P (Signed)
[facility]   Primary Care Physician:  Marinda Elk, MD Ophthalmologist: Dr. Leandrew Koyanagi  Pre-Procedure History & Physical: HPI:  Bruce Garza is a 76 y.o. male here for ophthalmic surgery.   Past Medical History:  Diagnosis Date   Basal cell carcinoma 07/04/2020   left nasal bridge/L med canthus/lower eyelid. Excised 09/06/2020, margins free.   Elevated serum immunoglobulin free light chains    History of shingles    Hyperlipidemia    Hypertension    MGUS (monoclonal gammopathy of unknown significance)    Sleep apnea    CPAP   Wears dentures    full upper and lower    Past Surgical History:  Procedure Laterality Date   CATARACT EXTRACTION W/PHACO Right 06/06/2022   Procedure: CATARACT EXTRACTION PHACO AND INTRAOCULAR LENS PLACEMENT ([place]) RIGHT DIABETIC 7.60 01:04.5;  Surgeon: Leandrew Koyanagi, MD;  Location: [place];  Service: Ophthalmology;  Laterality: Right;  sleep apnea   COLONOSCOPY WITH PROPOFOL N/A 07/22/2015   Procedure: COLONOSCOPY WITH PROPOFOL;  Surgeon: Lollie Sails, MD;  Location: [facility] ENDOSCOPY;  Service: Endoscopy;  Laterality: N/A;   excision ganglion cyst wrist      Prior to Admission medications   Medication Sig Start Date End Date Taking? Authorizing Provider  amLODipine (NORVASC) 10 MG tablet Take 5 mg by mouth daily. 09/03/19  Yes [provider]  ascorbic acid (VITAMIN C) 1000 MG tablet Take 1,000 mg by mouth daily.   Yes [provider]  Cholecalciferol (VITAMIN D3) 25 MCG (1000 UT) CAPS Take by mouth.   Yes [provider]  dorzolamide-timolol (COSOPT) 22.3-6.8 MG/ML ophthalmic solution Apply to eye 2 (two) times daily. 02/28/15  Yes [provider]  ketorolac (ACULAR) 0.5 % ophthalmic solution Apply 1 drop into right eye four times a day  for 5 days 09/23/20  Yes [provider]  latanoprost (XALATAN) 0.005 % ophthalmic solution Apply to eye.   Yes [provider]  latanoprost (XALATAN) 0.005 % ophthalmic solution Place into both eyes at bedtime.   Yes [provider]  losartan (COZAAR) 25 MG tablet Take 1 tablet by mouth every evening. 10/31/21 10/31/22 Yes [provider]  Multiple Vitamins-Minerals (OCUVITE ADULT 50+ PO) Take 1 capsule by mouth in the morning and at bedtime.    Yes [provider]  prednisoLONE acetate (PRED FORTE) 1 % ophthalmic suspension 1 drop in the morning and at bedtime.    Yes [provider]  rosuvastatin (CRESTOR) 5 MG tablet  09/26/20  Yes [provider]  spironolactone (ALDACTONE) 25 MG tablet Take 25 mg by mouth 2 (two) times daily.   Yes [provider]  MODERNA COVID-19 VACCINE 100 MCG/0.5ML injection  11/24/20   [provider]    Allergies as of 04/10/2022 - Review Complete 03/12/2022  Allergen Reaction Noted   Lisinopril Swelling and Other (See Comments) 11/21/2017    No family history on file.  Social History   Socioeconomic History   Marital status: Married    Spouse name: pam   Number of children: Not on file   Years of education: Not on file   Highest education level: Not on file  Occupational History   Not on file  Tobacco Use   Smoking status: Never   Smokeless tobacco: Never  Vaping Use   Vaping Use: Never used  Substance and Sexual Activity   Alcohol use: Never   Drug use: Never   Sexual activity: Not on file  Other Topics Concern   Not on file  Social History Narrative   Not on file   Social Determinants of Health   Financial Resource Strain: Not on file  Food Insecurity: Not on file  Transportation Needs: Not on file  Physical Activity: Not on file  Stress: Not on file  Social Connections: Not on file  Intimate Partner Violence: Not on file    Review of Systems: See HPI, otherwise negative ROS  Physical Exam: BP (!) 123/59   Pulse (!) 58   Temp (!) 97.3 F (36.3 C) (Temporal)   Resp 16   Ht 5'  6" (1.676 m)   Wt 83.9 kg   SpO2 97%   BMI 29.86 kg/m  General:   Alert,  pleasant and cooperative in NAD Head:  Normocephalic and atraumatic. Lungs:  Clear to auscultation.    Heart:  Regular rate and rhythm.   Impression/Plan: Bruce Garza is here for ophthalmic surgery.  Risks, benefits, limitations, and alternatives regarding ophthalmic surgery have been reviewed with the patient.  Questions have been answered.  All parties agreeable.   Leandrew Koyanagi, MD  06/20/2022, 7:31 AM

## 2022-06-20 NOTE — Anesthesia Postprocedure Evaluation (Signed)
Anesthesia Post Note  Patient: Bruce Garza  Procedure(s) Performed: CATARACT EXTRACTION PHACO AND INTRAOCULAR LENS PLACEMENT (IOC) LEFT DIABETIC 6.74 00:49.2  (Left: Eye)     Patient location during evaluation: PACU Anesthesia Type: MAC Level of consciousness: awake and alert Pain management: pain level controlled Vital Signs Assessment: post-procedure vital signs reviewed and stable Respiratory status: spontaneous breathing, nonlabored ventilation, respiratory function stable and patient connected to nasal cannula oxygen Cardiovascular status: stable and blood pressure returned to baseline Postop Assessment: no apparent nausea or vomiting Anesthetic complications: no   There were no known notable events for this encounter.  Arita Miss

## 2022-06-20 NOTE — Op Note (Signed)
  OPERATIVE NOTE  Bruce Garza [id number] 06/20/2022   PREOPERATIVE DIAGNOSIS:  Nuclear sclerotic cataract left eye. H25.12   POSTOPERATIVE DIAGNOSIS:    Nuclear sclerotic cataract left eye.     PROCEDURE:  Phacoemusification with posterior chamber intraocular lens placement of the left eye  Ultrasound time: Procedure(s) with comments: CATARACT EXTRACTION PHACO AND INTRAOCULAR LENS PLACEMENT (IOC) LEFT DIABETIC 6.74 00:49.2  (Left) - sleep apnea  LENS:   Implant Name Type Inv. Item Serial No. Manufacturer Lot No. LRB No. Used Action  LENS IOL DIOP 20.0 - [id number] Intraocular Lens LENS IOL DIOP 20.0 [id number] [employer]  Left 1 Implanted      SURGEON:  Wyonia Hough, MD   ANESTHESIA:  Topical with tetracaine drops and 2% Xylocaine jelly, augmented with 1% preservative-free intracameral lidocaine.    COMPLICATIONS:  None.   DESCRIPTION OF PROCEDURE:  The patient was identified in the holding room and transported to the operating room and placed in the supine position under the operating microscope.  The left eye was identified as the operative eye and it was prepped and draped in the usual sterile ophthalmic fashion.   A 1 millimeter clear-corneal paracentesis was made at the 1:30 position.  0.5 ml of preservative-free 1% lidocaine was injected into the anterior chamber.  The anterior chamber was filled with Viscoat viscoelastic.  A 2.4 millimeter keratome was used to make a near-clear corneal incision at the 10:30 position.  .  A curvilinear capsulorrhexis was made with a cystotome and capsulorrhexis forceps.  Balanced salt solution was used to hydrodissect and hydrodelineate the nucleus.   Phacoemulsification was then used in stop and chop fashion to remove the lens nucleus and epinucleus.  The remaining cortex was then removed using the irrigation and aspiration handpiece. Provisc was then placed into the capsular bag to distend it for lens placement.  A lens was then  injected into the capsular bag.  The remaining viscoelastic was aspirated.   Wounds were hydrated with balanced salt solution.  The anterior chamber was inflated to a physiologic pressure with balanced salt solution.  No wound leaks were noted. Cefuroxime 0.1 ml of a '10mg'$ /ml solution was injected into the anterior chamber for a dose of 1 mg of intracameral antibiotic at the completion of the case.   Timolol and Brimonidine drops were applied to the eye.  The patient was taken to the recovery room in stable condition without complications of anesthesia or surgery.  [name] 06/20/2022, 8:23 AM

## 2022-06-21 ENCOUNTER — Encounter: Payer: Self-pay | Admitting: Ophthalmology

## 2022-07-04 DIAGNOSIS — H6123 Impacted cerumen, bilateral: Secondary | ICD-10-CM | POA: Diagnosis not present

## 2022-07-04 DIAGNOSIS — H93292 Other abnormal auditory perceptions, left ear: Secondary | ICD-10-CM | POA: Diagnosis not present

## 2022-07-06 ENCOUNTER — Inpatient Hospital Stay: Payer: Medicare HMO | Attending: Oncology

## 2022-07-06 ENCOUNTER — Encounter: Payer: Self-pay | Admitting: Oncology

## 2022-07-06 ENCOUNTER — Inpatient Hospital Stay: Payer: Medicare HMO | Admitting: Oncology

## 2022-07-06 VITALS — BP 116/50 | HR 47 | Temp 97.6°F | Wt 186.2 lb

## 2022-07-06 DIAGNOSIS — N183 Chronic kidney disease, stage 3 unspecified: Secondary | ICD-10-CM | POA: Insufficient documentation

## 2022-07-06 DIAGNOSIS — D472 Monoclonal gammopathy: Secondary | ICD-10-CM

## 2022-07-06 DIAGNOSIS — Z79899 Other long term (current) drug therapy: Secondary | ICD-10-CM | POA: Diagnosis not present

## 2022-07-06 DIAGNOSIS — I129 Hypertensive chronic kidney disease with stage 1 through stage 4 chronic kidney disease, or unspecified chronic kidney disease: Secondary | ICD-10-CM | POA: Insufficient documentation

## 2022-07-06 DIAGNOSIS — E785 Hyperlipidemia, unspecified: Secondary | ICD-10-CM | POA: Diagnosis not present

## 2022-07-06 DIAGNOSIS — E1122 Type 2 diabetes mellitus with diabetic chronic kidney disease: Secondary | ICD-10-CM | POA: Insufficient documentation

## 2022-07-06 DIAGNOSIS — G4730 Sleep apnea, unspecified: Secondary | ICD-10-CM | POA: Insufficient documentation

## 2022-07-06 LAB — COMPREHENSIVE METABOLIC PANEL
ALT: 22 U/L (ref 0–44)
AST: 21 U/L (ref 15–41)
Albumin: 4.2 g/dL (ref 3.5–5.0)
Alkaline Phosphatase: 61 U/L (ref 38–126)
Anion gap: 4 — ABNORMAL LOW (ref 5–15)
BUN: 30 mg/dL — ABNORMAL HIGH (ref 8–23)
CO2: 26 mmol/L (ref 22–32)
Calcium: 9.1 mg/dL (ref 8.9–10.3)
Chloride: 107 mmol/L (ref 98–111)
Creatinine, Ser: 1.89 mg/dL — ABNORMAL HIGH (ref 0.61–1.24)
GFR, Estimated: 36 mL/min — ABNORMAL LOW (ref >60)
Glucose, Bld: 156 mg/dL — ABNORMAL HIGH (ref 70–99)
Potassium: 4.2 mmol/L (ref 3.5–5.1)
Sodium: 137 mmol/L (ref 135–145)
Total Bilirubin: 1 mg/dL (ref 0.3–1.2)
Total Protein: 7.1 g/dL (ref 6.5–8.1)

## 2022-07-06 LAB — CBC WITH DIFFERENTIAL/PLATELET
Abs Immature Granulocytes: 0.02 10*3/uL (ref 0.00–0.07)
Basophils Absolute: 0 10*3/uL (ref 0.0–0.1)
Basophils Relative: 0 %
Eosinophils Absolute: 0.4 10*3/uL (ref 0.0–0.5)
Eosinophils Relative: 6 %
HCT: 42.1 % (ref 39.0–52.0)
Hemoglobin: 14.3 g/dL (ref 13.0–17.0)
Immature Granulocytes: 0 %
Lymphocytes Relative: 22 %
Lymphs Abs: 1.7 10*3/uL (ref 0.7–4.0)
MCH: 32.9 pg (ref 26.0–34.0)
MCHC: 34 g/dL (ref 30.0–36.0)
MCV: 97 fL (ref 80.0–100.0)
Monocytes Absolute: 0.5 10*3/uL (ref 0.1–1.0)
Monocytes Relative: 7 %
Neutro Abs: 4.9 10*3/uL (ref 1.7–7.7)
Neutrophils Relative %: 65 %
Platelets: 154 10*3/uL (ref 150–400)
RBC: 4.34 MIL/uL (ref 4.22–5.81)
RDW: 12.1 % (ref 11.5–15.5)
WBC: 7.5 10*3/uL (ref 4.0–10.5)
nRBC: 0 % (ref 0.0–0.2)

## 2022-07-06 NOTE — Progress Notes (Signed)
Hematology/Oncology Consult note [facility]  Telephone:([phone] [phone]  Patient Care Team: Marinda Elk, MD as PCP - General (Physician Assistant)   Name of the patient: Bruce Garza  [id number]  [birth year]   Date of visit: 07/06/22  Diagnosis-IgA MGUS  Chief complaint/ Reason for visit-routine follow-up of MGUS  Heme/Onc history:  patient is a 76 year-old male with a past medical history significant for hypertension hyperlipidemia and stage III chronic kidney disease for which he sees Dr. Holley Raring.  He has been referred to [place] for elevated free light chains.Most recent 6 CMP from 04/27/2019 showed creatinine of 1.4 and BUN of 25.  Calcium was 9.7.  Electrolytes and LFTs were normal.  CBC showed a white count of 8.7, H&H of 15.9/45.9 and a platelet count of 157.   Results of blood work from 05/26/2019 were as follows: CBC was normal with an H&H of 15.4/45.4.  CMP was significant for serum creatinine of 1.5.  Calcium normal at 9.3.  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  Free light chain showed elevated kappa of 61 with a free light chain ratio of 3.1.  24-hour urine protein electrophoresis did not reveal any evidence of M protein.   Patient underwent bone marrow biopsy given the elevated free light chain ratio which showed 3% plasma cells but was not consistent with multiple myeloma and consistent with light chain MGUS    Interval history-patient states he is doing well.  Appetite and weight have remained stable.  Denies any new aches and pains anywhere  ECOG PS- 1 Pain scale- 0   Review of systems- Review of Systems  Constitutional:  Negative for chills, fever, malaise/fatigue and weight loss.  HENT:  Negative for congestion, ear discharge and nosebleeds.   Eyes:  Negative for blurred vision.  Respiratory:  Negative for cough, hemoptysis, sputum production,  shortness of breath and wheezing.   Cardiovascular:  Negative for chest pain, palpitations, orthopnea and claudication.  Gastrointestinal:  Negative for abdominal pain, blood in stool, constipation, diarrhea, heartburn, melena, nausea and vomiting.  Genitourinary:  Negative for dysuria, flank pain, frequency, hematuria and urgency.  Musculoskeletal:  Negative for back pain, joint pain and myalgias.  Skin:  Negative for rash.  Neurological:  Negative for dizziness, tingling, focal weakness, seizures, weakness and headaches.  Endo/Heme/Allergies:  Does not bruise/bleed easily.  Psychiatric/Behavioral:  Negative for depression and suicidal ideas. The patient does not have insomnia.       Allergies  Allergen Reactions   Lisinopril Swelling and Other (See Comments)    Facial      Past Medical History:  Diagnosis Date   Basal cell carcinoma 07/04/2020   left nasal bridge/L med canthus/lower eyelid. Excised 09/06/2020, margins free.   Elevated serum immunoglobulin free light chains    History of shingles    Hyperlipidemia    Hypertension    MGUS (monoclonal gammopathy of unknown significance)    Sleep apnea    CPAP   Wears dentures    full upper and lower     Past Surgical History:  Procedure Laterality Date   CATARACT EXTRACTION W/PHACO Right 06/06/2022   Procedure: CATARACT EXTRACTION PHACO AND INTRAOCULAR LENS PLACEMENT ([place]) RIGHT DIABETIC 7.60 01:04.5;  Surgeon: Leandrew Koyanagi, MD;  Location: [place];  Service: Ophthalmology;  Laterality: Right;  sleep apnea   CATARACT EXTRACTION W/PHACO Left 06/20/2022   Procedure: CATARACT EXTRACTION PHACO AND  INTRAOCULAR LENS PLACEMENT (IOC) LEFT DIABETIC 6.74 00:49.2 ;  Surgeon: Leandrew Koyanagi, MD;  Location: [facility];  Service: Ophthalmology;  Laterality: Left;  sleep apnea   COLONOSCOPY WITH PROPOFOL N/A 07/22/2015   Procedure: COLONOSCOPY WITH PROPOFOL;  Surgeon: Lollie Sails, MD;  [facility]  ENDOSCOPY;  Service: Endoscopy;  Laterality: N/A;   excision ganglion cyst wrist      Social History   Socioeconomic History   Marital status: Married    Spouse name: pam   Number of children: Not on file   Years of education: Not on file   Highest education level: Not on file  Occupational History   Not on file  Tobacco Use   Smoking status: Never   Smokeless tobacco: Never  Vaping Use   Vaping Use: Never used  Substance and Sexual Activity   Alcohol use: Never   Drug use: Never   Sexual activity: Not on file  Other Topics Concern   Not on file  Social History Narrative   Not on file   Social Determinants of Health   Financial Resource Strain: Not on file  Food Insecurity: Not on file  Transportation Needs: Not on file  Physical Activity: Not on file  Stress: Not on file  Social Connections: Not on file  Intimate Partner Violence: Not on file    History reviewed. No pertinent family history.   Current Outpatient Medications:    amLODipine (NORVASC) 5 MG tablet, Take 5 mg by mouth daily., Disp: , Rfl:    ascorbic acid (VITAMIN C) 1000 MG tablet, Take 1,000 mg by mouth daily., Disp: , Rfl:    Cholecalciferol (VITAMIN D3) 25 MCG (1000 UT) CAPS, Take by mouth., Disp: , Rfl:    dorzolamide-timolol (COSOPT) 22.3-6.8 MG/ML ophthalmic solution, Apply to eye 2 (two) times daily., Disp: , Rfl:    latanoprost (XALATAN) 0.005 % ophthalmic solution, Place into both eyes at bedtime., Disp: , Rfl:    losartan (COZAAR) 25 MG tablet, Take 1 tablet by mouth every evening., Disp: , Rfl:    prednisoLONE acetate (PRED FORTE) 1 % ophthalmic suspension, 1 drop in the morning and at bedtime. , Disp: , Rfl:    rosuvastatin (CRESTOR) 5 MG tablet, , Disp: , Rfl:    spironolactone (ALDACTONE) 25 MG tablet, Take 25 mg by mouth 2 (two) times daily., Disp: , Rfl:    MODERNA COVID-19 VACCINE 100 MCG/0.5ML injection, , Disp: , Rfl:    Multiple Vitamins-Minerals (OCUVITE ADULT 50+ PO), Take 1  capsule by mouth in the morning and at bedtime.  (Patient not taking: Reported on 07/06/2022), Disp: , Rfl:   Physical exam:  Vitals:   07/06/22 1023  BP: (!) 116/50  Pulse: (!) 47  Temp: 97.6 F (36.4 C)  SpO2: 99%  Weight: 186 lb 3.2 oz (84.5 kg)   Physical Exam Constitutional:      General: He is not in acute distress. Cardiovascular:     Rate and Rhythm: Normal rate and regular rhythm.     Heart sounds: Normal heart sounds.  Pulmonary:     Effort: Pulmonary effort is normal.     Breath sounds: Normal breath sounds.  Skin:    General: Skin is warm and dry.  Neurological:     Mental Status: He is alert and oriented to person, place, and time.         Latest Ref Rng & Units 07/06/2022    9:56 AM  CMP  Glucose 70 - 99  mg/dL 156   BUN 8 - 23 mg/dL 30   Creatinine 0.61 - 1.24 mg/dL 1.89   Sodium 135 - 145 mmol/L 137   Potassium 3.5 - 5.1 mmol/L 4.2   Chloride 98 - 111 mmol/L 107   CO2 22 - 32 mmol/L 26   Calcium 8.9 - 10.3 mg/dL 9.1   Total Protein 6.5 - 8.1 g/dL 7.1   Total Bilirubin 0.3 - 1.2 mg/dL 1.0   Alkaline Phos 38 - 126 U/L 61   AST 15 - 41 U/L 21   ALT 0 - 44 U/L 22       Latest Ref Rng & Units 07/06/2022    9:56 AM  CBC  WBC 4.0 - 10.5 K/uL 7.5   Hemoglobin 13.0 - 17.0 g/dL 14.3   Hematocrit 39.0 - 52.0 % 42.1   Platelets 150 - 400 K/uL 154     Assessment and plan- Patient is a 76 y.o. male here for routine follow-up of MGUS   CBC is presently normal.  CMP shows mildly worse creatinine of 1.89.  Unclear if it is secondary to poor oral intake of fluids.  He does follow-up with nephrology and per their assessment as well his kidney functions have been stable.  Myeloma panel and serum free light chain is currently pending.  If there is worsening of his free light chain ratio I will consider getting another bone survey and potentially repeating his bone marrow biopsy.    Visit Diagnosis 1. MGUS (monoclonal gammopathy of unknown significance)       Dr. Randa Evens, MD, MPH [facility] [phone] 07/06/2022 12:57 PM

## 2022-07-09 LAB — KAPPA/LAMBDA LIGHT CHAINS
Kappa free light chain: 70.7 mg/L — ABNORMAL HIGH (ref 3.3–19.4)
Kappa, lambda light chain ratio: 4.34 — ABNORMAL HIGH (ref 0.26–1.65)
Lambda free light chains: 16.3 mg/L (ref 5.7–26.3)

## 2022-07-12 LAB — MULTIPLE MYELOMA PANEL, SERUM
Albumin SerPl Elph-Mcnc: 3.8 g/dL (ref 2.9–4.4)
Albumin/Glob SerPl: 1.6 (ref 0.7–1.7)
Alpha 1: 0.2 g/dL (ref 0.0–0.4)
Alpha2 Glob SerPl Elph-Mcnc: 0.6 g/dL (ref 0.4–1.0)
B-Globulin SerPl Elph-Mcnc: 1.1 g/dL (ref 0.7–1.3)
Gamma Glob SerPl Elph-Mcnc: 0.7 g/dL (ref 0.4–1.8)
Globulin, Total: 2.5 g/dL (ref 2.2–3.9)
IgA: 410 mg/dL (ref 61–437)
IgG (Immunoglobin G), Serum: 758 mg/dL (ref 603–1613)
IgM (Immunoglobulin M), Srm: 44 mg/dL (ref 15–143)
Total Protein ELP: 6.3 g/dL (ref 6.0–8.5)

## 2022-09-24 DIAGNOSIS — N1832 Chronic kidney disease, stage 3b: Secondary | ICD-10-CM | POA: Diagnosis not present

## 2022-09-26 DIAGNOSIS — N1831 Chronic kidney disease, stage 3a: Secondary | ICD-10-CM | POA: Diagnosis not present

## 2022-09-26 DIAGNOSIS — R7303 Prediabetes: Secondary | ICD-10-CM | POA: Diagnosis not present

## 2022-09-26 DIAGNOSIS — I10 Essential (primary) hypertension: Secondary | ICD-10-CM | POA: Diagnosis not present

## 2022-09-26 DIAGNOSIS — E782 Mixed hyperlipidemia: Secondary | ICD-10-CM | POA: Diagnosis not present

## 2022-10-01 DIAGNOSIS — H401133 Primary open-angle glaucoma, bilateral, severe stage: Secondary | ICD-10-CM | POA: Diagnosis not present

## 2022-10-03 DIAGNOSIS — E782 Mixed hyperlipidemia: Secondary | ICD-10-CM | POA: Diagnosis not present

## 2022-10-03 DIAGNOSIS — I129 Hypertensive chronic kidney disease with stage 1 through stage 4 chronic kidney disease, or unspecified chronic kidney disease: Secondary | ICD-10-CM | POA: Diagnosis not present

## 2022-10-03 DIAGNOSIS — Z0000 Encounter for general adult medical examination without abnormal findings: Secondary | ICD-10-CM | POA: Diagnosis not present

## 2022-10-03 DIAGNOSIS — D472 Monoclonal gammopathy: Secondary | ICD-10-CM | POA: Diagnosis not present

## 2022-10-03 DIAGNOSIS — R7303 Prediabetes: Secondary | ICD-10-CM | POA: Diagnosis not present

## 2022-10-03 DIAGNOSIS — N1831 Chronic kidney disease, stage 3a: Secondary | ICD-10-CM | POA: Diagnosis not present

## 2022-10-03 DIAGNOSIS — G4733 Obstructive sleep apnea (adult) (pediatric): Secondary | ICD-10-CM | POA: Diagnosis not present

## 2022-10-05 ENCOUNTER — Inpatient Hospital Stay: Payer: Medicare HMO | Attending: Oncology

## 2022-10-05 DIAGNOSIS — D472 Monoclonal gammopathy: Secondary | ICD-10-CM | POA: Insufficient documentation

## 2022-10-05 LAB — CBC WITH DIFFERENTIAL/PLATELET
Abs Immature Granulocytes: 0.02 10*3/uL (ref 0.00–0.07)
Basophils Absolute: 0 10*3/uL (ref 0.0–0.1)
Basophils Relative: 0 %
Eosinophils Absolute: 0.5 10*3/uL (ref 0.0–0.5)
Eosinophils Relative: 6 %
HCT: 40.9 % (ref 39.0–52.0)
Hemoglobin: 14 g/dL (ref 13.0–17.0)
Immature Granulocytes: 0 %
Lymphocytes Relative: 25 %
Lymphs Abs: 2.1 10*3/uL (ref 0.7–4.0)
MCH: 33.3 pg (ref 26.0–34.0)
MCHC: 34.2 g/dL (ref 30.0–36.0)
MCV: 97.4 fL (ref 80.0–100.0)
Monocytes Absolute: 0.6 10*3/uL (ref 0.1–1.0)
Monocytes Relative: 7 %
Neutro Abs: 5.1 10*3/uL (ref 1.7–7.7)
Neutrophils Relative %: 62 %
Platelets: 195 10*3/uL (ref 150–400)
RBC: 4.2 MIL/uL — ABNORMAL LOW (ref 4.22–5.81)
RDW: 12.3 % (ref 11.5–15.5)
WBC: 8.2 10*3/uL (ref 4.0–10.5)
nRBC: 0 % (ref 0.0–0.2)

## 2022-10-05 LAB — COMPREHENSIVE METABOLIC PANEL
ALT: 22 U/L (ref 0–44)
AST: 21 U/L (ref 15–41)
Albumin: 4.3 g/dL (ref 3.5–5.0)
Alkaline Phosphatase: 66 U/L (ref 38–126)
Anion gap: 9 (ref 5–15)
BUN: 25 mg/dL — ABNORMAL HIGH (ref 8–23)
CO2: 25 mmol/L (ref 22–32)
Calcium: 8.9 mg/dL (ref 8.9–10.3)
Chloride: 105 mmol/L (ref 98–111)
Creatinine, Ser: 1.41 mg/dL — ABNORMAL HIGH (ref 0.61–1.24)
GFR, Estimated: 52 mL/min — ABNORMAL LOW (ref >60)
Glucose, Bld: 116 mg/dL — ABNORMAL HIGH (ref 70–99)
Potassium: 4.3 mmol/L (ref 3.5–5.1)
Sodium: 139 mmol/L (ref 135–145)
Total Bilirubin: 0.9 mg/dL (ref 0.3–1.2)
Total Protein: 7.1 g/dL (ref 6.5–8.1)

## 2022-10-09 LAB — KAPPA/LAMBDA LIGHT CHAINS
Kappa free light chain: 75.4 mg/L — ABNORMAL HIGH (ref 3.3–19.4)
Kappa, lambda light chain ratio: 4.6 — ABNORMAL HIGH (ref 0.26–1.65)
Lambda free light chains: 16.4 mg/L (ref 5.7–26.3)

## 2022-10-11 LAB — MULTIPLE MYELOMA PANEL, SERUM
Albumin SerPl Elph-Mcnc: 3.7 g/dL (ref 2.9–4.4)
Albumin/Glob SerPl: 1.4 (ref 0.7–1.7)
Alpha 1: 0.2 g/dL (ref 0.0–0.4)
Alpha2 Glob SerPl Elph-Mcnc: 0.6 g/dL (ref 0.4–1.0)
B-Globulin SerPl Elph-Mcnc: 1.2 g/dL (ref 0.7–1.3)
Gamma Glob SerPl Elph-Mcnc: 0.6 g/dL (ref 0.4–1.8)
Globulin, Total: 2.7 g/dL (ref 2.2–3.9)
IgA: 432 mg/dL (ref 61–437)
IgG (Immunoglobin G), Serum: 763 mg/dL (ref 603–1613)
IgM (Immunoglobulin M), Srm: 46 mg/dL (ref 15–143)
M Protein SerPl Elph-Mcnc: 0.3 g/dL — ABNORMAL HIGH
Total Protein ELP: 6.4 g/dL (ref 6.0–8.5)

## 2022-10-23 DIAGNOSIS — I10 Essential (primary) hypertension: Secondary | ICD-10-CM | POA: Diagnosis not present

## 2022-10-23 DIAGNOSIS — N1831 Chronic kidney disease, stage 3a: Secondary | ICD-10-CM | POA: Diagnosis not present

## 2022-10-23 DIAGNOSIS — D472 Monoclonal gammopathy: Secondary | ICD-10-CM | POA: Diagnosis not present

## 2022-10-25 DIAGNOSIS — H353132 Nonexudative age-related macular degeneration, bilateral, intermediate dry stage: Secondary | ICD-10-CM | POA: Diagnosis not present

## 2022-10-25 DIAGNOSIS — H401133 Primary open-angle glaucoma, bilateral, severe stage: Secondary | ICD-10-CM | POA: Diagnosis not present

## 2023-01-04 ENCOUNTER — Inpatient Hospital Stay: Payer: Medicare HMO | Admitting: Oncology

## 2023-01-04 ENCOUNTER — Encounter: Payer: Self-pay | Admitting: Oncology

## 2023-01-04 ENCOUNTER — Inpatient Hospital Stay: Payer: Medicare HMO | Attending: Oncology

## 2023-01-04 VITALS — BP 109/47 | HR 70 | Temp 97.7°F | Resp 18 | Ht 65.0 in | Wt 186.8 lb

## 2023-01-04 DIAGNOSIS — N183 Chronic kidney disease, stage 3 unspecified: Secondary | ICD-10-CM | POA: Insufficient documentation

## 2023-01-04 DIAGNOSIS — D472 Monoclonal gammopathy: Secondary | ICD-10-CM | POA: Insufficient documentation

## 2023-01-04 DIAGNOSIS — G4730 Sleep apnea, unspecified: Secondary | ICD-10-CM | POA: Insufficient documentation

## 2023-01-04 DIAGNOSIS — E1122 Type 2 diabetes mellitus with diabetic chronic kidney disease: Secondary | ICD-10-CM | POA: Insufficient documentation

## 2023-01-04 DIAGNOSIS — Z79899 Other long term (current) drug therapy: Secondary | ICD-10-CM | POA: Insufficient documentation

## 2023-01-04 DIAGNOSIS — E785 Hyperlipidemia, unspecified: Secondary | ICD-10-CM | POA: Insufficient documentation

## 2023-01-04 DIAGNOSIS — I129 Hypertensive chronic kidney disease with stage 1 through stage 4 chronic kidney disease, or unspecified chronic kidney disease: Secondary | ICD-10-CM | POA: Diagnosis not present

## 2023-01-04 LAB — CBC WITH DIFFERENTIAL/PLATELET
Abs Immature Granulocytes: 0.01 10*3/uL (ref 0.00–0.07)
Basophils Absolute: 0 10*3/uL (ref 0.0–0.1)
Basophils Relative: 0 %
Eosinophils Absolute: 0.4 10*3/uL (ref 0.0–0.5)
Eosinophils Relative: 5 %
HCT: 41.6 % (ref 39.0–52.0)
Hemoglobin: 13.6 g/dL (ref 13.0–17.0)
Immature Granulocytes: 0 %
Lymphocytes Relative: 19 %
Lymphs Abs: 1.4 10*3/uL (ref 0.7–4.0)
MCH: 32.2 pg (ref 26.0–34.0)
MCHC: 32.7 g/dL (ref 30.0–36.0)
MCV: 98.6 fL (ref 80.0–100.0)
Monocytes Absolute: 0.5 10*3/uL (ref 0.1–1.0)
Monocytes Relative: 6 %
Neutro Abs: 5.2 10*3/uL (ref 1.7–7.7)
Neutrophils Relative %: 70 %
Platelets: 174 10*3/uL (ref 150–400)
RBC: 4.22 MIL/uL (ref 4.22–5.81)
RDW: 12.5 % (ref 11.5–15.5)
WBC: 7.5 10*3/uL (ref 4.0–10.5)
nRBC: 0 % (ref 0.0–0.2)

## 2023-01-04 LAB — COMPREHENSIVE METABOLIC PANEL
ALT: 23 U/L (ref 0–44)
AST: 23 U/L (ref 15–41)
Albumin: 4.1 g/dL (ref 3.5–5.0)
Alkaline Phosphatase: 64 U/L (ref 38–126)
Anion gap: 4 — ABNORMAL LOW (ref 5–15)
BUN: 24 mg/dL — ABNORMAL HIGH (ref 8–23)
CO2: 25 mmol/L (ref 22–32)
Calcium: 9.2 mg/dL (ref 8.9–10.3)
Chloride: 109 mmol/L (ref 98–111)
Creatinine, Ser: 1.54 mg/dL — ABNORMAL HIGH (ref 0.61–1.24)
GFR, Estimated: 46 mL/min — ABNORMAL LOW (ref >60)
Glucose, Bld: 119 mg/dL — ABNORMAL HIGH (ref 70–99)
Potassium: 4.3 mmol/L (ref 3.5–5.1)
Sodium: 138 mmol/L (ref 135–145)
Total Bilirubin: 0.9 mg/dL (ref 0.3–1.2)
Total Protein: 7 g/dL (ref 6.5–8.1)

## 2023-01-04 NOTE — Progress Notes (Signed)
No concerns for the provider. 

## 2023-01-04 NOTE — Progress Notes (Signed)
Hematology/Oncology Consult note [facility]  [phone] Fax:([phone]) [phone]  Patient Care Team: Marinda Elk, MD as PCP - General (Physician Assistant)   Name of the patient: Bruce Garza  [id number]  July 28, 1946   Date of visit: 01/04/23  Diagnosis-IgA MGUS  Chief complaint/ Reason for visit-routine follow-up of MGUS  Heme/Onc history: patient is a 77 year-old male with a past medical history significant for hypertension hyperlipidemia and stage III chronic kidney disease for which he sees Dr. Holley Raring.  He has been referred to [place] for elevated free light chains.Most recent 6 CMP from 04/27/2019 showed creatinine of 1.4 and BUN of 25.  Calcium was 9.7.  Electrolytes and LFTs were normal.  CBC showed a white count of 8.7, H&H of 15.9/45.9 and a platelet count of 157.   Results of blood work from 05/26/2019 were as follows: CBC was normal with an H&H of 15.4/45.4.  CMP was significant for serum creatinine of 1.5.  Calcium normal at 9.3.  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  Free light chain showed elevated kappa of 61 with a free light chain ratio of 3.1.  24-hour urine protein electrophoresis did not reveal any evidence of M protein.   Patient underwent bone marrow biopsy given the elevated free light chain ratio which showed 3% plasma cells but was not consistent with multiple myeloma  Interval history-patient is doing well for his age.  He has not had any recent hospitalizations or infections.  No recent falls.  Appetite and weight have remained stable.  Denies any new aches and pains anywhere  ECOG PS- 1 Pain scale- 0 Opioid associated constipation- no  Review of systems- Review of Systems  Constitutional:  Negative for chills, fever, malaise/fatigue and weight loss.  HENT:  Negative for congestion, ear discharge and nosebleeds.   Eyes:  Negative for blurred  vision.  Respiratory:  Negative for cough, hemoptysis, sputum production, shortness of breath and wheezing.   Cardiovascular:  Negative for chest pain, palpitations, orthopnea and claudication.  Gastrointestinal:  Negative for abdominal pain, blood in stool, constipation, diarrhea, heartburn, melena, nausea and vomiting.  Genitourinary:  Negative for dysuria, flank pain, frequency, hematuria and urgency.  Musculoskeletal:  Negative for back pain, joint pain and myalgias.  Skin:  Negative for rash.  Neurological:  Negative for dizziness, tingling, focal weakness, seizures, weakness and headaches.  Endo/Heme/Allergies:  Does not bruise/bleed easily.  Psychiatric/Behavioral:  Negative for depression and suicidal ideas. The patient does not have insomnia.       Allergies  Allergen Reactions   Lisinopril Swelling and Other (See Comments)    Facial      Past Medical History:  Diagnosis Date   Basal cell carcinoma 07/04/2020   left nasal bridge/L med canthus/lower eyelid. Excised 09/06/2020, margins free.   Elevated serum immunoglobulin free light chains    History of shingles    Hyperlipidemia    Hypertension    MGUS (monoclonal gammopathy of unknown significance)    Sleep apnea    CPAP   Wears dentures    full upper and lower     Past Surgical History:  Procedure Laterality Date   CATARACT EXTRACTION W/PHACO Right 06/06/2022   Procedure: CATARACT EXTRACTION PHACO AND INTRAOCULAR LENS PLACEMENT ([place]) RIGHT DIABETIC 7.60 01:04.5;  Surgeon: Leandrew Koyanagi, MD;  Location: [place];  Service: Ophthalmology;  Laterality: Right;  sleep apnea   CATARACT EXTRACTION W/PHACO  Left 06/20/2022   Procedure: CATARACT EXTRACTION PHACO AND INTRAOCULAR LENS PLACEMENT ([facility]) LEFT DIABETIC 6.74 00:49.2 ;  Surgeon: Leandrew Koyanagi, MD;  Location: [place];  Service: Ophthalmology;  Laterality: Left;  sleep apnea   COLONOSCOPY WITH PROPOFOL N/A 07/22/2015   Procedure:  COLONOSCOPY WITH PROPOFOL;  Surgeon: Lollie Sails, MD;  Location: [facility] ENDOSCOPY;  Service: Endoscopy;  Laterality: N/A;   excision ganglion cyst wrist      Social History   Socioeconomic History   Marital status: Married    Spouse name: pam   Number of children: Not on file   Years of education: Not on file   Highest education level: Not on file  Occupational History   Not on file  Tobacco Use   Smoking status: Never   Smokeless tobacco: Never  Vaping Use   Vaping Use: Never used  Substance and Sexual Activity   Alcohol use: Never   Drug use: Never   Sexual activity: Not on file  Other Topics Concern   Not on file  Social History Narrative   Not on file   Social Determinants of Health   Financial Resource Strain: Not on file  Food Insecurity: Not on file  Transportation Needs: Not on file  Physical Activity: Not on file  Stress: Not on file  Social Connections: Not on file  Intimate Partner Violence: Not on file    History reviewed. No pertinent family history.   Current Outpatient Medications:    amLODipine (NORVASC) 5 MG tablet, Take 5 mg by mouth daily., Disp: , Rfl:    ascorbic acid (VITAMIN C) 1000 MG tablet, Take 1,000 mg by mouth daily., Disp: , Rfl:    Cholecalciferol (VITAMIN D3) 25 MCG (1000 UT) CAPS, Take by mouth., Disp: , Rfl:    dorzolamide-timolol (COSOPT) 22.3-6.8 MG/ML ophthalmic solution, Apply to eye 2 (two) times daily., Disp: , Rfl:    latanoprost (XALATAN) 0.005 % ophthalmic solution, Place into both eyes at bedtime., Disp: , Rfl:    MODERNA COVID-19 VACCINE 100 MCG/0.5ML injection, , Disp: , Rfl:    Multiple Vitamins-Minerals (OCUVITE ADULT 50+ PO), Take 1 capsule by mouth in the morning and at bedtime., Disp: , Rfl:    prednisoLONE acetate (PRED FORTE) 1 % ophthalmic suspension, 1 drop in the morning and at bedtime. , Disp: , Rfl:    rosuvastatin (CRESTOR) 5 MG tablet, , Disp: , Rfl:    spironolactone (ALDACTONE) 25 MG tablet, Take 25  mg by mouth 2 (two) times daily., Disp: , Rfl:    losartan (COZAAR) 25 MG tablet, Take 1 tablet by mouth every evening., Disp: , Rfl:   Physical exam:  Vitals:   01/04/23 1037  BP: (!) 109/47  Pulse: 70  Resp: 18  Temp: 97.7 F (36.5 C)  TempSrc: Tympanic  SpO2: 97%  Weight: 186 lb 12.8 oz (84.7 kg)  Height: 5\' 5"  (1.651 m)   Physical Exam Cardiovascular:     Rate and Rhythm: Normal rate and regular rhythm.     Heart sounds: Normal heart sounds.  Pulmonary:     Effort: Pulmonary effort is normal.     Breath sounds: Normal breath sounds.  Skin:    General: Skin is warm and dry.  Neurological:     Mental Status: He is alert and oriented to person, place, and time.         Latest Ref Rng & Units 01/04/2023   10:22 AM  CMP  Glucose 70 - 99 mg/dL  119   BUN 8 - 23 mg/dL 24   Creatinine 0.61 - 1.24 mg/dL 1.54   Sodium 135 - 145 mmol/L 138   Potassium 3.5 - 5.1 mmol/L 4.3   Chloride 98 - 111 mmol/L 109   CO2 22 - 32 mmol/L 25   Calcium 8.9 - 10.3 mg/dL 9.2   Total Protein 6.5 - 8.1 g/dL 7.0   Total Bilirubin 0.3 - 1.2 mg/dL 0.9   Alkaline Phos 38 - 126 U/L 64   AST 15 - 41 U/L 23   ALT 0 - 44 U/L 23       Latest Ref Rng & Units 01/04/2023   10:22 AM  CBC  WBC 4.0 - 10.5 K/uL 7.5   Hemoglobin 13.0 - 17.0 g/dL 13.6   Hematocrit 39.0 - 52.0 % 41.6   Platelets 150 - 400 K/uL 174      Assessment and plan- Patient is a 77 y.o. male here for routine follow-up of IgA MGUS  Kidney functions are presently stable calcium levels are normal and patient is not anemic.  Myeloma panel and serum free light chains are pending from today.  Values from December 2023 shows stable M protein that fluctuates between 0.2-0.4 over the last 1 year.  Prior to that he did not have any detectable M protein on SPEP but has always had IgA kappa monoclonal protein detected on immunofixation.  Serum free kappa light chain has remains stable in the 70s over the last 9 months although in 2022 it was  more between 50-60 range.  Presently patient does not have any crab criteria for myeloma.  I will continue to monitor his labs every 6 months and see him at that time.  If there is any concerning change in his lab work from today we will give him a call   Visit Diagnosis 1. MGUS (monoclonal gammopathy of unknown significance)      Dr. Randa Evens, MD, MPH [facility] at [facility] [phone] 01/04/2023 3:48 PM

## 2023-01-07 LAB — KAPPA/LAMBDA LIGHT CHAINS
Kappa free light chain: 84.3 mg/L — ABNORMAL HIGH (ref 3.3–19.4)
Kappa, lambda light chain ratio: 5.58 — ABNORMAL HIGH (ref 0.26–1.65)
Lambda free light chains: 15.1 mg/L (ref 5.7–26.3)

## 2023-01-09 LAB — MULTIPLE MYELOMA PANEL, SERUM
Albumin SerPl Elph-Mcnc: 3.8 g/dL (ref 2.9–4.4)
Albumin/Glob SerPl: 1.6 (ref 0.7–1.7)
Alpha 1: 0.2 g/dL (ref 0.0–0.4)
Alpha2 Glob SerPl Elph-Mcnc: 0.5 g/dL (ref 0.4–1.0)
B-Globulin SerPl Elph-Mcnc: 1 g/dL (ref 0.7–1.3)
Gamma Glob SerPl Elph-Mcnc: 0.7 g/dL (ref 0.4–1.8)
Globulin, Total: 2.4 g/dL (ref 2.2–3.9)
IgA: 434 mg/dL (ref 61–437)
IgG (Immunoglobin G), Serum: 761 mg/dL (ref 603–1613)
IgM (Immunoglobulin M), Srm: 46 mg/dL (ref 15–143)
Total Protein ELP: 6.2 g/dL (ref 6.0–8.5)

## 2023-01-10 ENCOUNTER — Other Ambulatory Visit: Payer: Self-pay | Admitting: *Deleted

## 2023-01-10 ENCOUNTER — Encounter: Payer: Self-pay | Admitting: Oncology

## 2023-01-10 ENCOUNTER — Telehealth: Payer: Self-pay | Admitting: *Deleted

## 2023-01-10 DIAGNOSIS — D472 Monoclonal gammopathy: Secondary | ICD-10-CM

## 2023-01-10 NOTE — Telephone Encounter (Signed)
I called the house and spoke to wife and told her that Dr. Janese Banks wanted to have the pt. To get a bone survey. It is simple xray that would take about 5 min. To get it done and it does not need to be scheduled. They can come in medical mall of [facility] and go to the admitting /registration booth and they will see the order and then pt will go get xray and that is all for that day- they said they will come Monday next week. Then also Dr. Janese Banks wanted to do labs again in July and they are ok with 7/12 and orders in and they are ok with it.

## 2023-01-14 ENCOUNTER — Ambulatory Visit
Admission: RE | Admit: 2023-01-14 | Discharge: 2023-01-14 | Disposition: A | Discharge status: Home / Self Care | Payer: Medicare HMO | Source: Ambulatory Visit | Attending: Oncology | Admitting: Oncology

## 2023-01-14 DIAGNOSIS — D472 Monoclonal gammopathy: Secondary | ICD-10-CM

## 2023-02-20 DIAGNOSIS — N1831 Chronic kidney disease, stage 3a: Secondary | ICD-10-CM | POA: Diagnosis not present

## 2023-02-26 DIAGNOSIS — G4733 Obstructive sleep apnea (adult) (pediatric): Secondary | ICD-10-CM | POA: Diagnosis not present

## 2023-02-27 DIAGNOSIS — N1832 Chronic kidney disease, stage 3b: Secondary | ICD-10-CM | POA: Diagnosis not present

## 2023-02-27 DIAGNOSIS — D631 Anemia in chronic kidney disease: Secondary | ICD-10-CM | POA: Diagnosis not present

## 2023-02-27 DIAGNOSIS — D472 Monoclonal gammopathy: Secondary | ICD-10-CM | POA: Diagnosis not present

## 2023-02-27 DIAGNOSIS — I10 Essential (primary) hypertension: Secondary | ICD-10-CM | POA: Diagnosis not present

## 2023-02-28 DIAGNOSIS — R739 Hyperglycemia, unspecified: Secondary | ICD-10-CM | POA: Diagnosis not present

## 2023-03-13 ENCOUNTER — Ambulatory Visit: Payer: Medicare HMO | Admitting: Dermatology

## 2023-03-13 ENCOUNTER — Encounter: Payer: Self-pay | Admitting: Dermatology

## 2023-03-13 VITALS — BP 122/62 | HR 60

## 2023-03-13 DIAGNOSIS — D1801 Hemangioma of skin and subcutaneous tissue: Secondary | ICD-10-CM

## 2023-03-13 DIAGNOSIS — W908XXA Exposure to other nonionizing radiation, initial encounter: Secondary | ICD-10-CM | POA: Diagnosis not present

## 2023-03-13 DIAGNOSIS — X32XXXA Exposure to sunlight, initial encounter: Secondary | ICD-10-CM | POA: Diagnosis not present

## 2023-03-13 DIAGNOSIS — L821 Other seborrheic keratosis: Secondary | ICD-10-CM

## 2023-03-13 DIAGNOSIS — L814 Other melanin hyperpigmentation: Secondary | ICD-10-CM

## 2023-03-13 DIAGNOSIS — D692 Other nonthrombocytopenic purpura: Secondary | ICD-10-CM

## 2023-03-13 DIAGNOSIS — L578 Other skin changes due to chronic exposure to nonionizing radiation: Secondary | ICD-10-CM | POA: Diagnosis not present

## 2023-03-13 DIAGNOSIS — Z1283 Encounter for screening for malignant neoplasm of skin: Secondary | ICD-10-CM | POA: Diagnosis not present

## 2023-03-13 DIAGNOSIS — D229 Melanocytic nevi, unspecified: Secondary | ICD-10-CM

## 2023-03-13 DIAGNOSIS — B079 Viral wart, unspecified: Secondary | ICD-10-CM

## 2023-03-13 DIAGNOSIS — Z85828 Personal history of other malignant neoplasm of skin: Secondary | ICD-10-CM

## 2023-03-13 NOTE — Progress Notes (Signed)
   Follow-Up Visit   Subjective  Bruce Garza is a 77 y.o. male who presents for the following: Skin Cancer Screening and Full Body Skin Exam The patient presents for Total-Body Skin Exam (TBSE) for skin cancer screening and mole check. The patient has spots, moles and lesions to be evaluated, some may be new or changing and the patient has concerns that these could be cancer.  The following portions of the chart were reviewed this encounter and updated as appropriate: medications, allergies, medical history  Review of Systems:  No other skin or systemic complaints except as noted in HPI or Assessment and Plan.  Objective  Well appearing patient in no apparent distress; mood and affect are within normal limits.  A full examination was performed including scalp, head, eyes, ears, nose, lips, neck, chest, axillae, abdomen, back, buttocks, bilateral upper extremities, bilateral lower extremities, hands, feet, fingers, toes, fingernails, and toenails. All findings within normal limits unless otherwise noted below.   Relevant physical exam findings are noted in the Assessment and Plan.   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  Previous treatments include LN2, topical agents, intralesional Candida antigen immunotherapy for wart. Discussed viral etiology and risk of spread.  Discussed multiple treatments may be required to clear warts.  Discussed possible post-treatment dyspigmentation and risk of recurrence. Previously recommended referral to Dr. Arita Miss - hand surgeon/plastic surgeon and pt declined, mentioned today pt still declines  MELANOCYTIC NEVI - Tan-brown and/or pink-flesh-colored symmetric macules and papules - Benign appearing on exam  today - Observation - Call clinic for new or changing moles - Recommend daily use of broad spectrum spf 30+ sunscreen to sun-exposed areas.   Purpura - Chronic; persistent and recurrent.  Treatable, but not curable. - Violaceous macules and patches - Benign - Related to trauma, age, sun damage and/or use of blood thinners, chronic use of topical and/or oral steroids - Observe - Can use OTC arnica containing moisturizer such as Dermend Bruise Formula if desired - Call for worsening or other concerns  ACTINIC DAMAGE - Chronic condition, secondary to cumulative UV/sun exposure - diffuse scaly erythematous macules with underlying dyspigmentation - Recommend daily broad spectrum sunscreen SPF 30+ to sun-exposed areas, reapply every 2 hours as needed.  - Staying in the shade or wearing long sleeves, sun glasses (UVA+UVB protection) and wide brim hats (4-inch brim around the entire circumference of the hat) are also recommended for sun protection.  - Call for new or changing lesions.  HISTORY OF BASAL CELL CARCINOMA OF THE SKIN Left nasal bridge, left medial canthus/ lower eyelid 06/2020 - No evidence of recurrence today - Recommend regular full body skin exams - Recommend daily broad spectrum sunscreen SPF 30+ to sun-exposed areas, reapply every 2 hours as needed.  - Call if any new or changing lesions are noted between office visits  SKIN CANCER SCREENING PERFORMED TODAY.   Return in about 1 year (around 03/12/2024) for TBSE.  IAsher Muir, CMA, am acting as scribe for Armida Sans, MD.  Documentation: I have reviewed the above documentation for accuracy and completeness, and I agree with the above.  Armida Sans, MD

## 2023-03-13 NOTE — Patient Instructions (Addendum)
     Melanoma ABCDEs  Melanoma is the most dangerous type of skin cancer, and is the leading cause of death from skin disease.  You are more likely to develop melanoma if you: Have light-colored skin, light-colored eyes, or red or blond hair Spend a lot of time in the sun Tan regularly, either outdoors or in a tanning bed Have had blistering sunburns, especially during childhood Have a close family member who has had a melanoma Have atypical moles or large birthmarks  Early detection of melanoma is key since treatment is typically straightforward and cure rates are extremely high if we catch it early.   The first sign of melanoma is often a change in a mole or a new dark spot.  The ABCDE system is a way of remembering the signs of melanoma.  A for asymmetry:  The two halves do not match. B for border:  The edges of the growth are irregular. C for color:  A mixture of colors are present instead of an even brown color. D for diameter:  Melanomas are usually (but not always) greater than 6mm - the size of a pencil eraser. E for evolution:  The spot keeps changing in size, shape, and color.  Please check your skin once per month between visits. You can use a small mirror in front and a large mirror behind you to keep an eye on the back side or your body.   If you see any new or changing lesions before your next follow-up, please call to schedule a visit.  Please continue daily skin protection including broad spectrum sunscreen SPF 30+ to sun-exposed areas, reapplying every 2 hours as needed when you're outdoors.   Staying in the shade or wearing long sleeves, sun glasses (UVA+UVB protection) and wide brim hats (4-inch brim around the entire circumference of the hat) are also recommended for sun protection.    Due to recent changes in healthcare laws, you may see results of your pathology and/or laboratory studies on MyChart before the doctors have had a chance to review them. We  understand that in some cases there may be results that are confusing or concerning to you. Please understand that not all results are received at the same time and often the doctors may need to interpret multiple results in order to provide you with the best plan of care or course of treatment. Therefore, we ask that you please give us 2 business days to thoroughly review all your results before contacting the office for clarification. Should we see a critical lab result, you will be contacted sooner.   If You Need Anything After Your Visit  If you have any questions or concerns for your doctor, please call our main line at [phone] and press option 4 to reach your doctor's medical assistant. If no one answers, please leave a voicemail as directed and we will return your call as soon as possible. Messages left after 4 pm will be answered the following business day.   You may also send us a message via MyChart. We typically respond to MyChart messages within 1-2 business days.  For prescription refills, please ask your pharmacy to contact our office. Our fax number is [phone].  If you have an urgent issue when the clinic is closed that cannot wait until the next business day, you can page your doctor at the number below.    Please note that while we do our best to be available for urgent issues   outside of office hours, we are not available 24/7.   If you have an urgent issue and are unable to reach us, you may choose to seek medical care at your doctor's office, retail clinic, urgent care center, or emergency room.  If you have a medical emergency, please immediately call 911 or go to the emergency department.  Pager Numbers  - Dr. Kowalski: [phone]  - Dr. Moye: [phone]  - Dr. Stewart: [phone]  In the event of inclement weather, please call our main line at [phone] for an update on the status of any delays or closures.  Dermatology Medication Tips: Please  keep the boxes that topical medications come in in order to help keep track of the instructions about where and how to use these. Pharmacies typically print the medication instructions only on the boxes and not directly on the medication tubes.   If your medication is too expensive, please contact our office at [phone] option 4 or send us a message through MyChart.   We are unable to tell what your co-pay for medications will be in advance as this is different depending on your insurance coverage. However, we may be able to find a substitute medication at lower cost or fill out paperwork to get insurance to cover a needed medication.   If a prior authorization is required to get your medication covered by your insurance company, please allow us 1-2 business days to complete this process.  Drug prices often vary depending on where the prescription is filled and some pharmacies may offer cheaper prices.  The website www.goodrx.com contains coupons for medications through different pharmacies. The prices here do not account for what the cost may be with help from insurance (it may be cheaper with your insurance), but the website can give you the price if you did not use any insurance.  - You can print the associated coupon and take it with your prescription to the pharmacy.  - You may also stop by our office during regular business hours and pick up a GoodRx coupon card.  - If you need your prescription sent electronically to a different pharmacy, notify our office through [facility] MyChart or by phone at [phone] option 4.     Si Usted Necesita Algo Despus de Su Visita  Tambin puede enviarnos un mensaje a travs de MyChart. Por lo general respondemos a los mensajes de MyChart en el transcurso de 1 a 2 das hbiles.  Para renovar recetas, por favor pida a su farmacia que se ponga en contacto con nuestra oficina. Nuestro nmero de fax es el [phone].  Si tiene un asunto urgente  cuando la clnica est cerrada y que no puede esperar hasta el siguiente da hbil, puede llamar/localizar a su doctor(a) al nmero que aparece a continuacin.   Por favor, tenga en cuenta que aunque hacemos todo lo posible para estar disponibles para asuntos urgentes fuera del horario de oficina, no estamos disponibles las 24 horas del da, los 7 das de la semana.   Si tiene un problema urgente y no puede comunicarse con nosotros, puede optar por buscar atencin mdica  en el consultorio de su doctor(a), en una clnica privada, en un centro de atencin urgente o en una sala de emergencias.  Si tiene una emergencia mdica, por favor llame inmediatamente al 911 o vaya a la sala de emergencias.  Nmeros de bper  - Dr. Kowalski: [phone]  - Dra. Moye: [phone]  - Dra. Stewart: [phone]  En caso   de inclemencias del tiempo, por favor llame a nuestra lnea principal al [phone] para una actualizacin sobre el estado de cualquier retraso o cierre.  Consejos para la medicacin en dermatologa: Por favor, guarde las cajas en las que vienen los medicamentos de uso tpico para ayudarle a seguir las instrucciones sobre dnde y cmo usarlos. Las farmacias generalmente imprimen las instrucciones del medicamento slo en las cajas y no directamente en los tubos del medicamento.   Si su medicamento es muy caro, por favor, pngase en contacto con nuestra oficina llamando al [phone] y presione la opcin 4 o envenos un mensaje a travs de MyChart.   No podemos decirle cul ser su copago por los medicamentos por adelantado ya que esto es diferente dependiendo de la cobertura de su seguro. Sin embargo, es posible que podamos encontrar un medicamento sustituto a menor costo o llenar un formulario para que el seguro cubra el medicamento que se considera necesario.   Si se requiere una autorizacin previa para que su compaa de seguros cubra su medicamento, por favor permtanos de 1 a 2  das hbiles para completar este proceso.  Los precios de los medicamentos varan con frecuencia dependiendo del lugar de dnde se surte la receta y alguna farmacias pueden ofrecer precios ms baratos.  El sitio web www.goodrx.com tiene cupones para medicamentos de diferentes farmacias. Los precios aqu no tienen en cuenta lo que podra costar con la ayuda del seguro (puede ser ms barato con su seguro), pero el sitio web puede darle el precio si no utiliz ningn seguro.  - Puede imprimir el cupn correspondiente y llevarlo con su receta a la farmacia.  - Tambin puede pasar por nuestra oficina durante el horario de atencin regular y recoger una tarjeta de cupones de GoodRx.  - Si necesita que su receta se enve electrnicamente a una farmacia diferente, informe a nuestra oficina a travs de MyChart de [facility] o por telfono llamando al [phone] y presione la opcin 4.  

## 2023-03-20 ENCOUNTER — Encounter: Payer: Self-pay | Admitting: Dermatology

## 2023-03-28 DIAGNOSIS — N1831 Chronic kidney disease, stage 3a: Secondary | ICD-10-CM | POA: Diagnosis not present

## 2023-03-28 DIAGNOSIS — I10 Essential (primary) hypertension: Secondary | ICD-10-CM | POA: Diagnosis not present

## 2023-03-28 DIAGNOSIS — R7303 Prediabetes: Secondary | ICD-10-CM | POA: Diagnosis not present

## 2023-03-28 DIAGNOSIS — E782 Mixed hyperlipidemia: Secondary | ICD-10-CM | POA: Diagnosis not present

## 2023-04-04 DIAGNOSIS — E782 Mixed hyperlipidemia: Secondary | ICD-10-CM | POA: Diagnosis not present

## 2023-04-04 DIAGNOSIS — N1831 Chronic kidney disease, stage 3a: Secondary | ICD-10-CM | POA: Diagnosis not present

## 2023-04-04 DIAGNOSIS — G4733 Obstructive sleep apnea (adult) (pediatric): Secondary | ICD-10-CM | POA: Diagnosis not present

## 2023-04-04 DIAGNOSIS — I129 Hypertensive chronic kidney disease with stage 1 through stage 4 chronic kidney disease, or unspecified chronic kidney disease: Secondary | ICD-10-CM | POA: Diagnosis not present

## 2023-04-04 DIAGNOSIS — R7303 Prediabetes: Secondary | ICD-10-CM | POA: Diagnosis not present

## 2023-04-04 DIAGNOSIS — Z0000 Encounter for general adult medical examination without abnormal findings: Secondary | ICD-10-CM | POA: Diagnosis not present

## 2023-04-04 DIAGNOSIS — D472 Monoclonal gammopathy: Secondary | ICD-10-CM | POA: Diagnosis not present

## 2023-04-15 ENCOUNTER — Inpatient Hospital Stay: Payer: Medicare HMO | Attending: Oncology

## 2023-04-15 DIAGNOSIS — D472 Monoclonal gammopathy: Secondary | ICD-10-CM | POA: Insufficient documentation

## 2023-04-26 ENCOUNTER — Inpatient Hospital Stay: Payer: Medicare HMO

## 2023-04-26 DIAGNOSIS — D472 Monoclonal gammopathy: Secondary | ICD-10-CM | POA: Diagnosis not present

## 2023-04-26 LAB — CBC WITH DIFFERENTIAL/PLATELET
Abs Immature Granulocytes: 0.02 10*3/uL (ref 0.00–0.07)
Basophils Absolute: 0 10*3/uL (ref 0.0–0.1)
Basophils Relative: 0 %
Eosinophils Absolute: 0.4 10*3/uL (ref 0.0–0.5)
Eosinophils Relative: 5 %
HCT: 41.1 % (ref 39.0–52.0)
Hemoglobin: 13.9 g/dL (ref 13.0–17.0)
Immature Granulocytes: 0 %
Lymphocytes Relative: 31 %
Lymphs Abs: 2.3 10*3/uL (ref 0.7–4.0)
MCH: 33.3 pg (ref 26.0–34.0)
MCHC: 33.8 g/dL (ref 30.0–36.0)
MCV: 98.3 fL (ref 80.0–100.0)
Monocytes Absolute: 0.5 10*3/uL (ref 0.1–1.0)
Monocytes Relative: 6 %
Neutro Abs: 4.3 10*3/uL (ref 1.7–7.7)
Neutrophils Relative %: 58 %
Platelets: 157 10*3/uL (ref 150–400)
RBC: 4.18 MIL/uL — ABNORMAL LOW (ref 4.22–5.81)
RDW: 12.5 % (ref 11.5–15.5)
WBC: 7.4 10*3/uL (ref 4.0–10.5)
nRBC: 0 % (ref 0.0–0.2)

## 2023-04-26 LAB — COMPREHENSIVE METABOLIC PANEL
ALT: 23 U/L (ref 0–44)
AST: 22 U/L (ref 15–41)
Albumin: 4.2 g/dL (ref 3.5–5.0)
Alkaline Phosphatase: 58 U/L (ref 38–126)
Anion gap: 8 (ref 5–15)
BUN: 31 mg/dL — ABNORMAL HIGH (ref 8–23)
CO2: 23 mmol/L (ref 22–32)
Calcium: 8.9 mg/dL (ref 8.9–10.3)
Chloride: 107 mmol/L (ref 98–111)
Creatinine, Ser: 1.51 mg/dL — ABNORMAL HIGH (ref 0.61–1.24)
GFR, Estimated: 47 mL/min — ABNORMAL LOW (ref >60)
Glucose, Bld: 141 mg/dL — ABNORMAL HIGH (ref 70–99)
Potassium: 4.2 mmol/L (ref 3.5–5.1)
Sodium: 138 mmol/L (ref 135–145)
Total Bilirubin: 1.1 mg/dL (ref 0.3–1.2)
Total Protein: 6.9 g/dL (ref 6.5–8.1)

## 2023-04-29 LAB — KAPPA/LAMBDA LIGHT CHAINS
Kappa free light chain: 80 mg/L — ABNORMAL HIGH (ref 3.3–19.4)
Kappa, lambda light chain ratio: 4.76 — ABNORMAL HIGH (ref 0.26–1.65)
Lambda free light chains: 16.8 mg/L (ref 5.7–26.3)

## 2023-04-30 DIAGNOSIS — H353132 Nonexudative age-related macular degeneration, bilateral, intermediate dry stage: Secondary | ICD-10-CM | POA: Diagnosis not present

## 2023-04-30 DIAGNOSIS — H35371 Puckering of macula, right eye: Secondary | ICD-10-CM | POA: Diagnosis not present

## 2023-04-30 DIAGNOSIS — Z961 Presence of intraocular lens: Secondary | ICD-10-CM | POA: Diagnosis not present

## 2023-04-30 DIAGNOSIS — H401133 Primary open-angle glaucoma, bilateral, severe stage: Secondary | ICD-10-CM | POA: Diagnosis not present

## 2023-05-01 LAB — MULTIPLE MYELOMA PANEL, SERUM
Albumin SerPl Elph-Mcnc: 3.8 g/dL (ref 2.9–4.4)
Albumin/Glob SerPl: 1.5 (ref 0.7–1.7)
Alpha 1: 0.2 g/dL (ref 0.0–0.4)
Alpha2 Glob SerPl Elph-Mcnc: 0.6 g/dL (ref 0.4–1.0)
B-Globulin SerPl Elph-Mcnc: 1.2 g/dL (ref 0.7–1.3)
Gamma Glob SerPl Elph-Mcnc: 0.7 g/dL (ref 0.4–1.8)
Globulin, Total: 2.6 g/dL (ref 2.2–3.9)
IgA: 409 mg/dL (ref 61–437)
IgG (Immunoglobin G), Serum: 738 mg/dL (ref 603–1613)
IgM (Immunoglobulin M), Srm: 42 mg/dL (ref 15–143)
M Protein SerPl Elph-Mcnc: 0.4 g/dL — ABNORMAL HIGH
Total Protein ELP: 6.4 g/dL (ref 6.0–8.5)

## 2023-05-02 ENCOUNTER — Telehealth: Payer: Self-pay | Admitting: *Deleted

## 2023-05-02 NOTE — Telephone Encounter (Signed)
Called home and spoke to wife due to the pt. Is  not home. I told the wife that the myeloma labs are stable and he will keep his next appt.

## 2023-05-02 NOTE — Telephone Encounter (Signed)
-----   Message from Creig Hines sent at 05/02/2023 10:23 AM EDT ----- Please let him know his myeloma labs are stable. Continue f/u as planned

## 2023-07-01 DIAGNOSIS — N1832 Chronic kidney disease, stage 3b: Secondary | ICD-10-CM | POA: Diagnosis not present

## 2023-07-04 DIAGNOSIS — N1832 Chronic kidney disease, stage 3b: Secondary | ICD-10-CM | POA: Diagnosis not present

## 2023-07-04 DIAGNOSIS — I10 Essential (primary) hypertension: Secondary | ICD-10-CM | POA: Diagnosis not present

## 2023-07-04 DIAGNOSIS — D472 Monoclonal gammopathy: Secondary | ICD-10-CM | POA: Diagnosis not present

## 2023-07-04 DIAGNOSIS — D631 Anemia in chronic kidney disease: Secondary | ICD-10-CM | POA: Diagnosis not present

## 2023-07-12 ENCOUNTER — Inpatient Hospital Stay: Payer: Medicare HMO

## 2023-07-15 ENCOUNTER — Inpatient Hospital Stay: Payer: Medicare HMO | Attending: Oncology

## 2023-07-15 DIAGNOSIS — D472 Monoclonal gammopathy: Secondary | ICD-10-CM | POA: Insufficient documentation

## 2023-07-15 LAB — COMPREHENSIVE METABOLIC PANEL
ALT: 22 U/L (ref 0–44)
AST: 18 U/L (ref 15–41)
Albumin: 4 g/dL (ref 3.5–5.0)
Alkaline Phosphatase: 63 U/L (ref 38–126)
Anion gap: 7 (ref 5–15)
BUN: 26 mg/dL — ABNORMAL HIGH (ref 8–23)
CO2: 26 mmol/L (ref 22–32)
Calcium: 9.3 mg/dL (ref 8.9–10.3)
Chloride: 106 mmol/L (ref 98–111)
Creatinine, Ser: 1.47 mg/dL — ABNORMAL HIGH (ref 0.61–1.24)
GFR, Estimated: 49 mL/min — ABNORMAL LOW (ref >60)
Glucose, Bld: 148 mg/dL — ABNORMAL HIGH (ref 70–99)
Potassium: 4.1 mmol/L (ref 3.5–5.1)
Sodium: 139 mmol/L (ref 135–145)
Total Bilirubin: 0.8 mg/dL (ref 0.3–1.2)
Total Protein: 7 g/dL (ref 6.5–8.1)

## 2023-07-15 LAB — CBC WITH DIFFERENTIAL/PLATELET
Abs Immature Granulocytes: 0.03 10*3/uL (ref 0.00–0.07)
Basophils Absolute: 0 10*3/uL (ref 0.0–0.1)
Basophils Relative: 0 %
Eosinophils Absolute: 0.4 10*3/uL (ref 0.0–0.5)
Eosinophils Relative: 5 %
HCT: 41.9 % (ref 39.0–52.0)
Hemoglobin: 13.9 g/dL (ref 13.0–17.0)
Immature Granulocytes: 0 %
Lymphocytes Relative: 24 %
Lymphs Abs: 1.8 10*3/uL (ref 0.7–4.0)
MCH: 32.9 pg (ref 26.0–34.0)
MCHC: 33.2 g/dL (ref 30.0–36.0)
MCV: 99.3 fL (ref 80.0–100.0)
Monocytes Absolute: 0.4 10*3/uL (ref 0.1–1.0)
Monocytes Relative: 5 %
Neutro Abs: 5 10*3/uL (ref 1.7–7.7)
Neutrophils Relative %: 66 %
Platelets: 163 10*3/uL (ref 150–400)
RBC: 4.22 MIL/uL (ref 4.22–5.81)
RDW: 12.3 % (ref 11.5–15.5)
WBC: 7.6 10*3/uL (ref 4.0–10.5)
nRBC: 0 % (ref 0.0–0.2)

## 2023-07-16 LAB — KAPPA/LAMBDA LIGHT CHAINS
Kappa free light chain: 88 mg/L — ABNORMAL HIGH (ref 3.3–19.4)
Kappa, lambda light chain ratio: 5.21 — ABNORMAL HIGH (ref 0.26–1.65)
Lambda free light chains: 16.9 mg/L (ref 5.7–26.3)

## 2023-07-17 LAB — MULTIPLE MYELOMA PANEL, SERUM
Albumin SerPl Elph-Mcnc: 3.6 g/dL (ref 2.9–4.4)
Albumin/Glob SerPl: 1.4 (ref 0.7–1.7)
Alpha 1: 0.2 g/dL (ref 0.0–0.4)
Alpha2 Glob SerPl Elph-Mcnc: 0.6 g/dL (ref 0.4–1.0)
B-Globulin SerPl Elph-Mcnc: 1.1 g/dL (ref 0.7–1.3)
Gamma Glob SerPl Elph-Mcnc: 0.6 g/dL (ref 0.4–1.8)
Globulin, Total: 2.6 g/dL (ref 2.2–3.9)
IgA: 409 mg/dL (ref 61–437)
IgG (Immunoglobin G), Serum: 696 mg/dL (ref 603–1613)
IgM (Immunoglobulin M), Srm: 44 mg/dL (ref 15–143)
M Protein SerPl Elph-Mcnc: 0.2 g/dL — ABNORMAL HIGH
Total Protein ELP: 6.2 g/dL (ref 6.0–8.5)

## 2023-07-19 ENCOUNTER — Ambulatory Visit: Payer: Medicare HMO | Admitting: Oncology

## 2023-07-20 ENCOUNTER — Other Ambulatory Visit: Payer: Self-pay | Admitting: *Deleted

## 2023-07-20 DIAGNOSIS — D472 Monoclonal gammopathy: Secondary | ICD-10-CM

## 2023-07-22 ENCOUNTER — Encounter: Payer: Self-pay | Admitting: Oncology

## 2023-07-22 ENCOUNTER — Inpatient Hospital Stay: Payer: Medicare HMO | Attending: Oncology | Admitting: Oncology

## 2023-07-22 VITALS — BP 115/60 | HR 56 | Temp 95.9°F | Resp 18 | Ht 66.0 in | Wt 181.0 lb

## 2023-07-22 DIAGNOSIS — D472 Monoclonal gammopathy: Secondary | ICD-10-CM | POA: Insufficient documentation

## 2023-07-22 DIAGNOSIS — Z79899 Other long term (current) drug therapy: Secondary | ICD-10-CM | POA: Diagnosis not present

## 2023-07-22 NOTE — Progress Notes (Signed)
Hematology/Oncology Consult note [facility]  Telephone:([phone] [phone]  Patient Care Team: Patrice Paradise, MD as PCP - General (Physician Assistant)   Name of the patient: Bruce Garza  [id number]  29-Sep-1946   Date of visit: 07/22/23  Diagnosis-IgA MGUS  Chief complaint/ Reason for visit-routine follow-up of IgA MGUS  Heme/Onc history: patient is a 77 year-old male with a past medical history significant for hypertension hyperlipidemia and stage III chronic kidney disease for which he sees Dr. Cherylann Ratel.  He has been referred to [place] for elevated free light chains.Most recent 6 CMP from 04/27/2019 showed creatinine of 1.4 and BUN of 25.  Calcium was 9.7.  Electrolytes and LFTs were normal.  CBC showed a white count of 8.7, H&H of 15.9/45.9 and a platelet count of 157.   Results of blood work from 05/26/2019 were as follows: CBC was normal with an H&H of 15.4/45.4.  CMP was significant for serum creatinine of 1.5.  Calcium normal at 9.3.  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  Free light chain showed elevated kappa of 61 with a free light chain ratio of 3.1.  24-hour urine protein electrophoresis did not reveal any evidence of M protein.   Patient underwent bone marrow biopsy given the elevated free light chain ratio which showed 3% plasma cells but was not consistent with multiple myeloma  Interval history-patient is doing well for his age.  No recent hospitalizations.  Appetite and weight have remained stable.  Denies any new aches and pains anywhere.  ECOG PS- 1 Pain scale- 0   Review of systems- Review of Systems  Constitutional:  Negative for chills, fever, malaise/fatigue and weight loss.  HENT:  Negative for congestion, ear discharge and nosebleeds.   Eyes:  Negative for blurred vision.  Respiratory:  Negative for cough, hemoptysis, sputum production,  shortness of breath and wheezing.   Cardiovascular:  Negative for chest pain, palpitations, orthopnea and claudication.  Gastrointestinal:  Negative for abdominal pain, blood in stool, constipation, diarrhea, heartburn, melena, nausea and vomiting.  Genitourinary:  Negative for dysuria, flank pain, frequency, hematuria and urgency.  Musculoskeletal:  Negative for back pain, joint pain and myalgias.  Skin:  Negative for rash.  Neurological:  Negative for dizziness, tingling, focal weakness, seizures, weakness and headaches.  Endo/Heme/Allergies:  Does not bruise/bleed easily.  Psychiatric/Behavioral:  Negative for depression and suicidal ideas. The patient does not have insomnia.       Allergies  Allergen Reactions   Lisinopril Swelling and Other (See Comments)    Facial      Past Medical History:  Diagnosis Date   Basal cell carcinoma 07/04/2020   left nasal bridge/L med canthus/lower eyelid. Excised 09/06/2020, margins free.   Elevated serum immunoglobulin free light chains    History of shingles    Hyperlipidemia    Hypertension    MGUS (monoclonal gammopathy of unknown significance)    Sleep apnea    CPAP   Wears dentures    full upper and lower     Past Surgical History:  Procedure Laterality Date   CATARACT EXTRACTION W/PHACO Right 06/06/2022   Procedure: CATARACT EXTRACTION PHACO AND INTRAOCULAR LENS PLACEMENT (IOC) RIGHT DIABETIC 7.60 01:04.5;  Surgeon: Lockie Mola, MD;  Location: [facility];  Service: Ophthalmology;  Laterality: Right;  sleep apnea   CATARACT EXTRACTION W/PHACO Left 06/20/2022   Procedure: CATARACT EXTRACTION PHACO AND INTRAOCULAR LENS PLACEMENT (  IOC) LEFT DIABETIC 6.74 00:49.2 ;  Surgeon: Lockie Mola, MD;  Location: [facility];  Service: Ophthalmology;  Laterality: Left;  sleep apnea   COLONOSCOPY WITH PROPOFOL N/A 07/22/2015   Procedure: COLONOSCOPY WITH PROPOFOL;  Surgeon: Christena Deem, MD;  Location: [facility]  ENDOSCOPY;  Service: Endoscopy;  Laterality: N/A;   excision ganglion cyst wrist      Social History   Socioeconomic History   Marital status: Married    Spouse name: pam   Number of children: Not on file   Years of education: Not on file   Highest education level: Not on file  Occupational History   Not on file  Tobacco Use   Smoking status: Never   Smokeless tobacco: Never  Vaping Use   Vaping status: Never Used  Substance and Sexual Activity   Alcohol use: Never   Drug use: Never   Sexual activity: Not on file  Other Topics Concern   Not on file  Social History Narrative   Not on file   Social Determinants of Health   Financial Resource Strain: Not on file  Food Insecurity: Not on file  Transportation Needs: Not on file  Physical Activity: Not on file  Stress: Not on file  Social Connections: Not on file  Intimate Partner Violence: Not on file    History reviewed. No pertinent family history.   Current Outpatient Medications:    amLODipine (NORVASC) 5 MG tablet, Take 5 mg by mouth daily., Disp: , Rfl:    ascorbic acid (VITAMIN C) 1000 MG tablet, Take 1,000 mg by mouth daily., Disp: , Rfl:    Cholecalciferol (VITAMIN D3) 25 MCG (1000 UT) CAPS, Take by mouth., Disp: , Rfl:    dorzolamide-timolol (COSOPT) 22.3-6.8 MG/ML ophthalmic solution, Apply to eye 2 (two) times daily., Disp: , Rfl:    latanoprost (XALATAN) 0.005 % ophthalmic solution, Place into both eyes at bedtime., Disp: , Rfl:    losartan (COZAAR) 25 MG tablet, Take 1 tablet by mouth every evening., Disp: , Rfl:    MODERNA COVID-19 VACCINE 100 MCG/0.5ML injection, , Disp: , Rfl:    Multiple Vitamins-Minerals (OCUVITE ADULT 50+ PO), Take 1 capsule by mouth in the morning and at bedtime., Disp: , Rfl:    rosuvastatin (CRESTOR) 5 MG tablet, , Disp: , Rfl:    spironolactone (ALDACTONE) 25 MG tablet, Take 25 mg by mouth 2 (two) times daily., Disp: , Rfl:    prednisoLONE acetate (PRED FORTE) 1 % ophthalmic  suspension, 1 drop in the morning and at bedtime.  (Patient not taking: Reported on 07/22/2023), Disp: , Rfl:   Physical exam:  Vitals:   07/22/23 1021  BP: 115/60  Pulse: (!) 56  Resp: 18  Temp: (!) 95.9 F (35.5 C)  TempSrc: Tympanic  SpO2: 98%  Weight: 181 lb (82.1 kg)  Height: 5\' 6"  (1.676 m)   Physical Exam Cardiovascular:     Rate and Rhythm: Normal rate and regular rhythm.     Heart sounds: Normal heart sounds.  Pulmonary:     Effort: Pulmonary effort is normal.     Breath sounds: Normal breath sounds.  Abdominal:     General: Bowel sounds are normal.     Palpations: Abdomen is soft.  Skin:    General: Skin is warm and dry.  Neurological:     Mental Status: He is alert and oriented to person, place, and time.         Latest Ref Rng & Units 07/15/2023  11:06 AM  CMP  Glucose 70 - 99 mg/dL 409   BUN 8 - 23 mg/dL 26   Creatinine 8.11 - 1.24 mg/dL 9.14   Sodium 782 - 956 mmol/L 139   Potassium 3.5 - 5.1 mmol/L 4.1   Chloride 98 - 111 mmol/L 106   CO2 22 - 32 mmol/L 26   Calcium 8.9 - 10.3 mg/dL 9.3   Total Protein 6.5 - 8.1 g/dL 7.0   Total Bilirubin 0.3 - 1.2 mg/dL 0.8   Alkaline Phos 38 - 126 U/L 63   AST 15 - 41 U/L 18   ALT 0 - 44 U/L 22       Latest Ref Rng & Units 07/15/2023   11:06 AM  CBC  WBC 4.0 - 10.5 K/uL 7.6   Hemoglobin 13.0 - 17.0 g/dL 21.3   Hematocrit 08.6 - 52.0 % 41.9   Platelets 150 - 400 K/uL 163      Assessment and plan- Patient is a 77 y.o. male here for routine Follow-up of IgA MGUS  Patient is not currently anemic with an H&H of 13.9/41.9.  Renal functions have remained stable with a creatinine that fluctuates between 1.4-1.8 over the last 3 to 4 years.  No evidence of hypercalcemia.  M protein has remained stable between 0.2 to 0.4 g of IgA kappa.  Serum free light chain ratio is currently 5.2 which was similar a year ago although it has mildly increased as compared to 3 to 4 years ago.  Overall there is no overt concern for  progression of his MGUS which we can continue to monitor with labs every 6 months and I will see him back in 1 year.  If there is a consistent increase in his free light chain ratio to greater than 8 I will consider repeating a bone marrow biopsy at that time as well as getting a PET scan  CBC with differential CMP myeloma panel and serum free light chains in 6 months in 1 year.  See Dr. Smith Robert in 1 year   Visit Diagnosis 1. MGUS (monoclonal gammopathy of unknown significance)      Dr. Owens Shark, MD, MPH [facility] [phone] 07/22/2023 1:40 PM

## 2023-09-26 DIAGNOSIS — E782 Mixed hyperlipidemia: Secondary | ICD-10-CM | POA: Diagnosis not present

## 2023-09-26 DIAGNOSIS — R7303 Prediabetes: Secondary | ICD-10-CM | POA: Diagnosis not present

## 2023-09-26 DIAGNOSIS — I10 Essential (primary) hypertension: Secondary | ICD-10-CM | POA: Diagnosis not present

## 2023-10-03 DIAGNOSIS — E782 Mixed hyperlipidemia: Secondary | ICD-10-CM | POA: Diagnosis not present

## 2023-10-03 DIAGNOSIS — D472 Monoclonal gammopathy: Secondary | ICD-10-CM | POA: Diagnosis not present

## 2023-10-03 DIAGNOSIS — D631 Anemia in chronic kidney disease: Secondary | ICD-10-CM | POA: Diagnosis not present

## 2023-10-03 DIAGNOSIS — Z23 Encounter for immunization: Secondary | ICD-10-CM | POA: Diagnosis not present

## 2023-10-03 DIAGNOSIS — I129 Hypertensive chronic kidney disease with stage 1 through stage 4 chronic kidney disease, or unspecified chronic kidney disease: Secondary | ICD-10-CM | POA: Diagnosis not present

## 2023-10-03 DIAGNOSIS — N1831 Chronic kidney disease, stage 3a: Secondary | ICD-10-CM | POA: Diagnosis not present

## 2023-10-03 DIAGNOSIS — G4733 Obstructive sleep apnea (adult) (pediatric): Secondary | ICD-10-CM | POA: Diagnosis not present

## 2023-10-03 DIAGNOSIS — R7303 Prediabetes: Secondary | ICD-10-CM | POA: Diagnosis not present

## 2023-10-21 DIAGNOSIS — N1832 Chronic kidney disease, stage 3b: Secondary | ICD-10-CM | POA: Diagnosis not present

## 2023-10-29 DIAGNOSIS — D631 Anemia in chronic kidney disease: Secondary | ICD-10-CM | POA: Diagnosis not present

## 2023-10-29 DIAGNOSIS — D472 Monoclonal gammopathy: Secondary | ICD-10-CM | POA: Diagnosis not present

## 2023-10-29 DIAGNOSIS — N1832 Chronic kidney disease, stage 3b: Secondary | ICD-10-CM | POA: Diagnosis not present

## 2023-10-29 DIAGNOSIS — I10 Essential (primary) hypertension: Secondary | ICD-10-CM | POA: Diagnosis not present

## 2023-10-31 DIAGNOSIS — H401133 Primary open-angle glaucoma, bilateral, severe stage: Secondary | ICD-10-CM | POA: Diagnosis not present

## 2023-11-07 DIAGNOSIS — Z961 Presence of intraocular lens: Secondary | ICD-10-CM | POA: Diagnosis not present

## 2023-11-07 DIAGNOSIS — H353132 Nonexudative age-related macular degeneration, bilateral, intermediate dry stage: Secondary | ICD-10-CM | POA: Diagnosis not present

## 2023-11-07 DIAGNOSIS — H401133 Primary open-angle glaucoma, bilateral, severe stage: Secondary | ICD-10-CM | POA: Diagnosis not present

## 2023-11-07 DIAGNOSIS — H35371 Puckering of macula, right eye: Secondary | ICD-10-CM | POA: Diagnosis not present

## 2023-12-20 DIAGNOSIS — H906 Mixed conductive and sensorineural hearing loss, bilateral: Secondary | ICD-10-CM | POA: Diagnosis not present

## 2023-12-20 DIAGNOSIS — H6123 Impacted cerumen, bilateral: Secondary | ICD-10-CM | POA: Diagnosis not present

## 2024-01-15 DIAGNOSIS — S99911A Unspecified injury of right ankle, initial encounter: Secondary | ICD-10-CM | POA: Diagnosis not present

## 2024-01-20 ENCOUNTER — Inpatient Hospital Stay: Payer: Medicare HMO | Attending: Oncology

## 2024-01-20 DIAGNOSIS — D472 Monoclonal gammopathy: Secondary | ICD-10-CM | POA: Diagnosis not present

## 2024-01-20 LAB — CBC WITH DIFFERENTIAL/PLATELET
Abs Immature Granulocytes: 0.05 10*3/uL (ref 0.00–0.07)
Basophils Absolute: 0 10*3/uL (ref 0.0–0.1)
Basophils Relative: 0 %
Eosinophils Absolute: 0 10*3/uL (ref 0.0–0.5)
Eosinophils Relative: 0 %
HCT: 41.5 % (ref 39.0–52.0)
Hemoglobin: 13.7 g/dL (ref 13.0–17.0)
Immature Granulocytes: 0 %
Lymphocytes Relative: 15 %
Lymphs Abs: 1.7 10*3/uL (ref 0.7–4.0)
MCH: 33 pg (ref 26.0–34.0)
MCHC: 33 g/dL (ref 30.0–36.0)
MCV: 100 fL (ref 80.0–100.0)
Monocytes Absolute: 0.6 10*3/uL (ref 0.1–1.0)
Monocytes Relative: 6 %
Neutro Abs: 8.9 10*3/uL — ABNORMAL HIGH (ref 1.7–7.7)
Neutrophils Relative %: 79 %
Platelets: 191 10*3/uL (ref 150–400)
RBC: 4.15 MIL/uL — ABNORMAL LOW (ref 4.22–5.81)
RDW: 12.4 % (ref 11.5–15.5)
WBC: 11.3 10*3/uL — ABNORMAL HIGH (ref 4.0–10.5)
nRBC: 0 % (ref 0.0–0.2)

## 2024-01-20 LAB — COMPREHENSIVE METABOLIC PANEL WITH GFR
ALT: 23 U/L (ref 0–44)
AST: 19 U/L (ref 15–41)
Albumin: 3.9 g/dL (ref 3.5–5.0)
Alkaline Phosphatase: 64 U/L (ref 38–126)
Anion gap: 6 (ref 5–15)
BUN: 38 mg/dL — ABNORMAL HIGH (ref 8–23)
CO2: 22 mmol/L (ref 22–32)
Calcium: 8.9 mg/dL (ref 8.9–10.3)
Chloride: 104 mmol/L (ref 98–111)
Creatinine, Ser: 1.57 mg/dL — ABNORMAL HIGH (ref 0.61–1.24)
GFR, Estimated: 45 mL/min — ABNORMAL LOW (ref >60)
Glucose, Bld: 221 mg/dL — ABNORMAL HIGH (ref 70–99)
Potassium: 3.9 mmol/L (ref 3.5–5.1)
Sodium: 132 mmol/L — ABNORMAL LOW (ref 135–145)
Total Bilirubin: 0.9 mg/dL (ref 0.0–1.2)
Total Protein: 6.6 g/dL (ref 6.5–8.1)

## 2024-01-21 LAB — KAPPA/LAMBDA LIGHT CHAINS
Kappa free light chain: 50.9 mg/L — ABNORMAL HIGH (ref 3.3–19.4)
Kappa, lambda light chain ratio: 4.07 — ABNORMAL HIGH (ref 0.26–1.65)
Lambda free light chains: 12.5 mg/L (ref 5.7–26.3)

## 2024-01-22 LAB — MULTIPLE MYELOMA PANEL, SERUM
Albumin SerPl Elph-Mcnc: 3.6 g/dL (ref 2.9–4.4)
Albumin/Glob SerPl: 1.4 (ref 0.7–1.7)
Alpha 1: 0.2 g/dL (ref 0.0–0.4)
Alpha2 Glob SerPl Elph-Mcnc: 0.6 g/dL (ref 0.4–1.0)
B-Globulin SerPl Elph-Mcnc: 1.2 g/dL (ref 0.7–1.3)
Gamma Glob SerPl Elph-Mcnc: 0.6 g/dL (ref 0.4–1.8)
Globulin, Total: 2.6 g/dL (ref 2.2–3.9)
IgA: 443 mg/dL — ABNORMAL HIGH (ref 61–437)
IgG (Immunoglobin G), Serum: 706 mg/dL (ref 603–1613)
IgM (Immunoglobulin M), Srm: 45 mg/dL (ref 15–143)
Total Protein ELP: 6.2 g/dL (ref 6.0–8.5)

## 2024-02-20 DIAGNOSIS — D631 Anemia in chronic kidney disease: Secondary | ICD-10-CM | POA: Diagnosis not present

## 2024-02-20 DIAGNOSIS — I10 Essential (primary) hypertension: Secondary | ICD-10-CM | POA: Diagnosis not present

## 2024-02-20 DIAGNOSIS — N1832 Chronic kidney disease, stage 3b: Secondary | ICD-10-CM | POA: Diagnosis not present

## 2024-02-24 DIAGNOSIS — G4733 Obstructive sleep apnea (adult) (pediatric): Secondary | ICD-10-CM | POA: Diagnosis not present

## 2024-02-25 DIAGNOSIS — N1832 Chronic kidney disease, stage 3b: Secondary | ICD-10-CM | POA: Diagnosis not present

## 2024-02-25 DIAGNOSIS — I10 Essential (primary) hypertension: Secondary | ICD-10-CM | POA: Diagnosis not present

## 2024-02-25 DIAGNOSIS — D631 Anemia in chronic kidney disease: Secondary | ICD-10-CM | POA: Diagnosis not present

## 2024-02-25 DIAGNOSIS — D472 Monoclonal gammopathy: Secondary | ICD-10-CM | POA: Diagnosis not present

## 2024-03-18 ENCOUNTER — Ambulatory Visit: Payer: Medicare HMO | Admitting: Dermatology

## 2024-03-26 DIAGNOSIS — E782 Mixed hyperlipidemia: Secondary | ICD-10-CM | POA: Diagnosis not present

## 2024-03-26 DIAGNOSIS — R7303 Prediabetes: Secondary | ICD-10-CM | POA: Diagnosis not present

## 2024-03-26 DIAGNOSIS — I10 Essential (primary) hypertension: Secondary | ICD-10-CM | POA: Diagnosis not present

## 2024-03-31 ENCOUNTER — Ambulatory Visit (INDEPENDENT_AMBULATORY_CARE_PROVIDER_SITE_OTHER): Admitting: Dermatology

## 2024-03-31 ENCOUNTER — Encounter: Payer: Self-pay | Admitting: Dermatology

## 2024-03-31 DIAGNOSIS — Z85828 Personal history of other malignant neoplasm of skin: Secondary | ICD-10-CM

## 2024-03-31 DIAGNOSIS — L814 Other melanin hyperpigmentation: Secondary | ICD-10-CM

## 2024-03-31 DIAGNOSIS — D1801 Hemangioma of skin and subcutaneous tissue: Secondary | ICD-10-CM

## 2024-03-31 DIAGNOSIS — L578 Other skin changes due to chronic exposure to nonionizing radiation: Secondary | ICD-10-CM | POA: Diagnosis not present

## 2024-03-31 DIAGNOSIS — Z1283 Encounter for screening for malignant neoplasm of skin: Secondary | ICD-10-CM

## 2024-03-31 DIAGNOSIS — L821 Other seborrheic keratosis: Secondary | ICD-10-CM | POA: Diagnosis not present

## 2024-03-31 DIAGNOSIS — L820 Inflamed seborrheic keratosis: Secondary | ICD-10-CM

## 2024-03-31 DIAGNOSIS — W908XXA Exposure to other nonionizing radiation, initial encounter: Secondary | ICD-10-CM

## 2024-03-31 DIAGNOSIS — D229 Melanocytic nevi, unspecified: Secondary | ICD-10-CM

## 2024-03-31 NOTE — Progress Notes (Signed)
   Follow-Up Visit   Subjective  Bruce Garza is a 78 y.o. male who presents for the following: Skin Cancer Screening and Upper Body Skin Exam, hx of BCC  The patient presents for Upper Body Skin Exam (UBSE) for skin cancer screening and mole check. The patient has spots, moles and lesions to be evaluated, some may be new or changing and the patient may have concern these could be cancer.  The following portions of the chart were reviewed this encounter and updated as appropriate: medications, allergies, medical history  Review of Systems:  No other skin or systemic complaints except as noted in HPI or Assessment and Plan.  Objective  Well appearing patient in no apparent distress; mood and affect are within normal limits.  All skin waist up examined. Relevant physical exam findings are noted in the Assessment and Plan.  right medial lower eyelid margin/medial canthus Stuck-on, waxy, tan-brown papules and plaques -- Discussed benign etiology and prognosis.   Assessment & Plan   INFLAMED SEBORRHEIC KERATOSIS right medial lower eyelid margin/medial canthus Symptomatic, irritating, patient would like treated.  Destruction of lesion - right medial lower eyelid margin/medial canthus Complexity: simple   Destruction method: cryotherapy   Informed consent: discussed and consent obtained   Timeout:  patient name, date of birth, surgical site, and procedure verified Lesion destroyed using liquid nitrogen: Yes   Region frozen until ice ball extended beyond lesion: Yes   Outcome: patient tolerated procedure well with no complications   Post-procedure details: wound care instructions given     Skin cancer screening performed today.  Actinic Damage - Chronic condition, secondary to cumulative UV/sun exposure - diffuse scaly erythematous macules with underlying dyspigmentation - Recommend daily broad spectrum sunscreen SPF 30+ to sun-exposed areas, reapply every 2 hours as needed.  -  Staying in the shade or wearing long sleeves, sun glasses (UVA+UVB protection) and wide brim hats (4-inch brim around the entire circumference of the hat) are also recommended for sun protection.  - Call for new or changing lesions.  Lentigines, Seborrheic Keratoses, Hemangiomas - Benign normal skin lesions - Benign-appearing - Call for any changes  Melanocytic Nevi - Tan-brown and/or pink-flesh-colored symmetric macules and papules - Benign appearing on exam today - Observation - Call clinic for new or changing moles - Recommend daily use of broad spectrum spf 30+ sunscreen to sun-exposed areas.   HISTORY OF BASAL CELL CARCINOMA OF THE SKIN Left nasal bridge/L med canthus/lower eyelid - No evidence of recurrence today - Recommend regular full body skin exams - Recommend daily broad spectrum sunscreen SPF 30+ to sun-exposed areas, reapply every 2 hours as needed.  - Call if any new or changing lesions are noted between office visits   Return in about 1 year (around 03/31/2025) for UBSE, hx of BCC.  IClara Crisp, CMA, am acting as scribe for Celine Collard, MD .   Documentation: I have reviewed the above documentation for accuracy and completeness, and I agree with the above.  Celine Collard, MD

## 2024-03-31 NOTE — Patient Instructions (Addendum)

## 2024-04-06 DIAGNOSIS — Z125 Encounter for screening for malignant neoplasm of prostate: Secondary | ICD-10-CM | POA: Diagnosis not present

## 2024-04-06 DIAGNOSIS — E782 Mixed hyperlipidemia: Secondary | ICD-10-CM | POA: Diagnosis not present

## 2024-04-06 DIAGNOSIS — R7303 Prediabetes: Secondary | ICD-10-CM | POA: Diagnosis not present

## 2024-04-06 DIAGNOSIS — Z0000 Encounter for general adult medical examination without abnormal findings: Secondary | ICD-10-CM | POA: Diagnosis not present

## 2024-04-06 DIAGNOSIS — D472 Monoclonal gammopathy: Secondary | ICD-10-CM | POA: Diagnosis not present

## 2024-04-06 DIAGNOSIS — G4733 Obstructive sleep apnea (adult) (pediatric): Secondary | ICD-10-CM | POA: Diagnosis not present

## 2024-04-06 DIAGNOSIS — N1831 Chronic kidney disease, stage 3a: Secondary | ICD-10-CM | POA: Diagnosis not present

## 2024-04-06 DIAGNOSIS — Z1331 Encounter for screening for depression: Secondary | ICD-10-CM | POA: Diagnosis not present

## 2024-04-06 DIAGNOSIS — I722 Aneurysm of renal artery: Secondary | ICD-10-CM | POA: Diagnosis not present

## 2024-05-14 DIAGNOSIS — H353132 Nonexudative age-related macular degeneration, bilateral, intermediate dry stage: Secondary | ICD-10-CM | POA: Diagnosis not present

## 2024-05-14 DIAGNOSIS — H401133 Primary open-angle glaucoma, bilateral, severe stage: Secondary | ICD-10-CM | POA: Diagnosis not present

## 2024-05-14 DIAGNOSIS — H43813 Vitreous degeneration, bilateral: Secondary | ICD-10-CM | POA: Diagnosis not present

## 2024-05-14 DIAGNOSIS — H35371 Puckering of macula, right eye: Secondary | ICD-10-CM | POA: Diagnosis not present

## 2024-06-24 DIAGNOSIS — D631 Anemia in chronic kidney disease: Secondary | ICD-10-CM | POA: Diagnosis not present

## 2024-06-24 DIAGNOSIS — N1832 Chronic kidney disease, stage 3b: Secondary | ICD-10-CM | POA: Diagnosis not present

## 2024-06-24 DIAGNOSIS — I10 Essential (primary) hypertension: Secondary | ICD-10-CM | POA: Diagnosis not present

## 2024-07-08 DIAGNOSIS — D472 Monoclonal gammopathy: Secondary | ICD-10-CM | POA: Diagnosis not present

## 2024-07-08 DIAGNOSIS — D631 Anemia in chronic kidney disease: Secondary | ICD-10-CM | POA: Diagnosis not present

## 2024-07-08 DIAGNOSIS — I10 Essential (primary) hypertension: Secondary | ICD-10-CM | POA: Diagnosis not present

## 2024-07-08 DIAGNOSIS — N1832 Chronic kidney disease, stage 3b: Secondary | ICD-10-CM | POA: Diagnosis not present

## 2024-07-21 ENCOUNTER — Inpatient Hospital Stay: Payer: Medicare HMO | Attending: Oncology

## 2024-07-21 DIAGNOSIS — E1122 Type 2 diabetes mellitus with diabetic chronic kidney disease: Secondary | ICD-10-CM | POA: Diagnosis not present

## 2024-07-21 DIAGNOSIS — N189 Chronic kidney disease, unspecified: Secondary | ICD-10-CM | POA: Insufficient documentation

## 2024-07-21 DIAGNOSIS — D472 Monoclonal gammopathy: Secondary | ICD-10-CM | POA: Diagnosis not present

## 2024-07-21 DIAGNOSIS — I129 Hypertensive chronic kidney disease with stage 1 through stage 4 chronic kidney disease, or unspecified chronic kidney disease: Secondary | ICD-10-CM | POA: Insufficient documentation

## 2024-07-21 DIAGNOSIS — Z85828 Personal history of other malignant neoplasm of skin: Secondary | ICD-10-CM | POA: Diagnosis not present

## 2024-07-21 DIAGNOSIS — E785 Hyperlipidemia, unspecified: Secondary | ICD-10-CM | POA: Insufficient documentation

## 2024-07-21 DIAGNOSIS — Z79899 Other long term (current) drug therapy: Secondary | ICD-10-CM | POA: Diagnosis not present

## 2024-07-21 LAB — COMPREHENSIVE METABOLIC PANEL WITH GFR
ALT: 24 U/L (ref 0–44)
AST: 25 U/L (ref 15–41)
Albumin: 4 g/dL (ref 3.5–5.0)
Alkaline Phosphatase: 62 U/L (ref 38–126)
Anion gap: 8 (ref 5–15)
BUN: 24 mg/dL — ABNORMAL HIGH (ref 8–23)
CO2: 21 mmol/L — ABNORMAL LOW (ref 22–32)
Calcium: 9.1 mg/dL (ref 8.9–10.3)
Chloride: 108 mmol/L (ref 98–111)
Creatinine, Ser: 1.63 mg/dL — ABNORMAL HIGH (ref 0.61–1.24)
GFR, Estimated: 43 mL/min — ABNORMAL LOW
Glucose, Bld: 155 mg/dL — ABNORMAL HIGH (ref 70–99)
Potassium: 3.9 mmol/L (ref 3.5–5.1)
Sodium: 137 mmol/L (ref 135–145)
Total Bilirubin: 1 mg/dL (ref 0.0–1.2)
Total Protein: 6.7 g/dL (ref 6.5–8.1)

## 2024-07-21 LAB — CBC WITH DIFFERENTIAL/PLATELET
Abs Immature Granulocytes: 0.09 K/uL — ABNORMAL HIGH (ref 0.00–0.07)
Basophils Absolute: 0 K/uL (ref 0.0–0.1)
Basophils Relative: 0 %
Eosinophils Absolute: 0.4 K/uL (ref 0.0–0.5)
Eosinophils Relative: 5 %
HCT: 39.3 % (ref 39.0–52.0)
Hemoglobin: 13.4 g/dL (ref 13.0–17.0)
Immature Granulocytes: 1 %
Lymphocytes Relative: 22 %
Lymphs Abs: 1.8 K/uL (ref 0.7–4.0)
MCH: 33.2 pg (ref 26.0–34.0)
MCHC: 34.1 g/dL (ref 30.0–36.0)
MCV: 97.3 fL (ref 80.0–100.0)
Monocytes Absolute: 0.5 K/uL (ref 0.1–1.0)
Monocytes Relative: 6 %
Neutro Abs: 5.4 K/uL (ref 1.7–7.7)
Neutrophils Relative %: 66 %
Platelets: 171 K/uL (ref 150–400)
RBC: 4.04 MIL/uL — ABNORMAL LOW (ref 4.22–5.81)
RDW: 12.3 % (ref 11.5–15.5)
WBC: 8.2 K/uL (ref 4.0–10.5)
nRBC: 0 % (ref 0.0–0.2)

## 2024-07-22 LAB — KAPPA/LAMBDA LIGHT CHAINS
Kappa free light chain: 70.6 mg/L — ABNORMAL HIGH (ref 3.3–19.4)
Kappa, lambda light chain ratio: 4.25 — ABNORMAL HIGH (ref 0.26–1.65)
Lambda free light chains: 16.6 mg/L (ref 5.7–26.3)

## 2024-07-23 LAB — MULTIPLE MYELOMA PANEL, SERUM
Albumin SerPl Elph-Mcnc: 3.6 g/dL (ref 2.9–4.4)
Albumin/Glob SerPl: 1.3 (ref 0.7–1.7)
Alpha 1: 0.2 g/dL (ref 0.0–0.4)
Alpha2 Glob SerPl Elph-Mcnc: 0.6 g/dL (ref 0.4–1.0)
B-Globulin SerPl Elph-Mcnc: 1.3 g/dL (ref 0.7–1.3)
Gamma Glob SerPl Elph-Mcnc: 0.6 g/dL (ref 0.4–1.8)
Globulin, Total: 2.8 g/dL (ref 2.2–3.9)
IgA: 462 mg/dL — ABNORMAL HIGH (ref 61–437)
IgG (Immunoglobin G), Serum: 720 mg/dL (ref 603–1613)
IgM (Immunoglobulin M), Srm: 43 mg/dL (ref 15–143)
Total Protein ELP: 6.4 g/dL (ref 6.0–8.5)

## 2024-08-04 ENCOUNTER — Encounter: Payer: Self-pay | Admitting: Oncology

## 2024-08-04 ENCOUNTER — Inpatient Hospital Stay: Payer: Medicare HMO | Admitting: Oncology

## 2024-08-04 VITALS — BP 110/58 | HR 62 | Temp 98.6°F | Resp 19 | Wt 185.1 lb

## 2024-08-04 DIAGNOSIS — D472 Monoclonal gammopathy: Secondary | ICD-10-CM | POA: Diagnosis not present

## 2024-08-04 DIAGNOSIS — Z85828 Personal history of other malignant neoplasm of skin: Secondary | ICD-10-CM | POA: Diagnosis not present

## 2024-08-04 DIAGNOSIS — E1122 Type 2 diabetes mellitus with diabetic chronic kidney disease: Secondary | ICD-10-CM | POA: Diagnosis not present

## 2024-08-04 DIAGNOSIS — N189 Chronic kidney disease, unspecified: Secondary | ICD-10-CM | POA: Diagnosis not present

## 2024-08-04 DIAGNOSIS — Z79899 Other long term (current) drug therapy: Secondary | ICD-10-CM | POA: Diagnosis not present

## 2024-08-04 DIAGNOSIS — E785 Hyperlipidemia, unspecified: Secondary | ICD-10-CM | POA: Diagnosis not present

## 2024-08-04 DIAGNOSIS — I129 Hypertensive chronic kidney disease with stage 1 through stage 4 chronic kidney disease, or unspecified chronic kidney disease: Secondary | ICD-10-CM | POA: Diagnosis not present

## 2024-08-04 NOTE — Progress Notes (Signed)
 Hematology/Oncology Consult note [facility]  [phone] Fax:([phone]  Patient Care Team: Marikay Eva POUR, [place] as PCP - General (Physician Assistant)   Name of the patient: Bruce Garza  [id number]  May 16, 1946   Date of visit: 08/04/24  Diagnosis-IgA MGUS low risk  Chief complaint/ Reason for visit-routine follow-up of MGUS  Heme/Onc history: patient is a 78 year-old male with a past medical history significant for hypertension hyperlipidemia and stage III chronic kidney disease for which he sees Dr. Marcelino.  He has been referred to us  for elevated free light chains.Most recent 6 CMP from 04/27/2019 showed creatinine of 1.4 and BUN of 25.  Calcium was 9.7.  Electrolytes and LFTs were normal.  CBC showed a white count of 8.7, H&H of 15.9/45.9 and a platelet count of 157.   Results of blood work from 05/26/2019 were as follows: CBC was normal with an H&H of 15.4/45.4.  CMP was significant for serum creatinine of 1.5.  Calcium normal at 9.3.  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  Free light chain showed elevated kappa of 61 with a free light chain ratio of 3.1.  24-hour urine protein electrophoresis did not reveal any evidence of M protein.   Patient underwent bone marrow biopsy given the elevated free light chain ratio which showed 3% plasma cells but was not consistent with multiple myeloma    Interval history- Bruce Garza is a 78 year old male with IgA MGUS who presents for annual follow-up.  He is being monitored for IgA MGUS, which has remained stable over the years. His complete blood count is normal, with stable hemoglobin levels around 13.4. His creatinine level is 1.63, which is similar to values from previous years. M protein levels fluctuate between undetectable to low levels (0.2 to 0.4), and his light chains have fluctuated but have not shown  significant changes over the last five years.  He has a history of low blood pressure and was previously on amlodipine, which was discontinued due to concerns about low blood pressure and fatigue. Blood pressure readings at home range from 106 to 110 systolic. Heart rate is in the fifties. He feels tired but sleeps well at night.  ECOG PS- 1 Pain scale- 0  Review of systems- Review of Systems  Constitutional:  Positive for malaise/fatigue. Negative for chills, fever and weight loss.  HENT:  Negative for congestion, ear discharge and nosebleeds.   Eyes:  Negative for blurred vision.  Respiratory:  Negative for cough, hemoptysis, sputum production, shortness of breath and wheezing.   Cardiovascular:  Negative for chest pain, palpitations, orthopnea and claudication.  Gastrointestinal:  Negative for abdominal pain, blood in stool, constipation, diarrhea, heartburn, melena, nausea and vomiting.  Genitourinary:  Negative for dysuria, flank pain, frequency, hematuria and urgency.  Musculoskeletal:  Negative for back pain, joint pain and myalgias.  Skin:  Negative for rash.  Neurological:  Negative for dizziness, tingling, focal weakness, seizures, weakness and headaches.  Endo/Heme/Allergies:  Does not bruise/bleed easily.  Psychiatric/Behavioral:  Negative for depression and suicidal ideas. The patient does not have insomnia.       Allergies  Allergen Reactions   Lisinopril Swelling and Other (See Comments)    Facial      Past Medical History:  Diagnosis Date   Basal cell carcinoma 07/04/2020   left nasal bridge/L med canthus/lower eyelid. Excised 09/06/2020, margins free.   Elevated serum  immunoglobulin free light chains    History of shingles    Hyperlipidemia    Hypertension    MGUS (monoclonal gammopathy of unknown significance)    Sleep apnea    CPAP   Wears dentures    full upper and lower     Past Surgical History:  Procedure Laterality Date   CATARACT EXTRACTION  W/PHACO Right 06/06/2022   Procedure: CATARACT EXTRACTION PHACO AND INTRAOCULAR LENS PLACEMENT (IOC) RIGHT DIABETIC 7.60 01:04.5;  Surgeon: Mittie Gaskin, MD;  Location: [facility];  Service: Ophthalmology;  Laterality: Right;  sleep apnea   CATARACT EXTRACTION W/PHACO Left 06/20/2022   Procedure: CATARACT EXTRACTION PHACO AND INTRAOCULAR LENS PLACEMENT (IOC) LEFT DIABETIC 6.74 00:49.2 ;  Surgeon: Mittie Gaskin, MD;  Location: [facility];  Service: Ophthalmology;  Laterality: Left;  sleep apnea   COLONOSCOPY WITH PROPOFOL  N/A 07/22/2015   Procedure: COLONOSCOPY WITH PROPOFOL ;  Surgeon: Gladis RAYMOND Mariner, MD;  Location: [facility];  Service: Endoscopy;  Laterality: N/A;   excision ganglion cyst wrist      Social History   Socioeconomic History   Marital status: Married    Spouse name: pam   Number of children: Not on file   Years of education: Not on file   Highest education level: Not on file  Occupational History   Not on file  Tobacco Use   Smoking status: Never   Smokeless tobacco: Never  Vaping Use   Vaping status: Never Used  Substance and Sexual Activity   Alcohol use: Never   Drug use: Never   Sexual activity: Not on file  Other Topics Concern   Not on file  Social History Narrative   Not on file   Social Drivers of Health   Financial Resource Strain: Low Risk  (04/06/2024)   Received from [facility]   Overall Financial Resource Strain (CARDIA)    Difficulty of Paying Living Expenses: Not hard at all  Food Insecurity: No Food Insecurity (04/06/2024)   Received from [facility]   Hunger Vital Sign    Within the past 12 months, you worried that your food would run out before you got the money to buy more.: Never true    Within the past 12 months, the food you bought just didn't last and you didn't have money to get more.: Never true  Transportation Needs: No Transportation Needs (04/06/2024)   Received  from [facility] - Transportation    In the past 12 months, has lack of transportation kept you from medical appointments or from getting medications?: No    Lack of Transportation (Non-Medical): No  Physical Activity: Not on file  Stress: Not on file  Social Connections: Not on file  Intimate Partner Violence: Not on file    History reviewed. No pertinent family history.   Current Outpatient Medications:    amLODipine (NORVASC) 5 MG tablet, Take 5 mg by mouth daily., Disp: , Rfl:    ascorbic acid (VITAMIN C) 1000 MG tablet, Take 1,000 mg by mouth daily., Disp: , Rfl:    Cholecalciferol (VITAMIN D3) 25 MCG (1000 UT) CAPS, Take by mouth., Disp: , Rfl:    dorzolamide-timolol  (COSOPT) 22.3-6.8 MG/ML ophthalmic solution, Apply to eye 2 (two) times daily., Disp: , Rfl:    latanoprost (XALATAN) 0.005 % ophthalmic solution, Place into both eyes at bedtime., Disp: , Rfl:    losartan (COZAAR) 25 MG tablet, Take 1 tablet by mouth every evening.,  Disp: , Rfl:    MODERNA COVID-19 VACCINE 100 MCG/0.5ML injection, , Disp: , Rfl:    Multiple Vitamins-Minerals (OCUVITE ADULT 50+ PO), Take 1 capsule by mouth in the morning and at bedtime., Disp: , Rfl:    prednisoLONE acetate (PRED FORTE) 1 % ophthalmic suspension, 1 drop in the morning and at bedtime. , Disp: , Rfl:    rosuvastatin (CRESTOR) 5 MG tablet, , Disp: , Rfl:    spironolactone (ALDACTONE) 25 MG tablet, Take 25 mg by mouth 2 (two) times daily., Disp: , Rfl:   Physical exam:  Vitals:   08/04/24 0958  BP: (!) 110/58  Pulse: 62  Resp: 19  Temp: 98.6 F (37 C)  SpO2: 99%  Weight: 185 lb 1.6 oz (84 kg)   Physical Exam Cardiovascular:     Rate and Rhythm: Normal rate and regular rhythm.     Heart sounds: Normal heart sounds.  Pulmonary:     Effort: Pulmonary effort is normal.     Breath sounds: Normal breath sounds.  Abdominal:     General: Bowel sounds are normal.     Palpations: Abdomen is soft.   Skin:    General: Skin is warm and dry.  Neurological:     Mental Status: He is alert and oriented to person, place, and time.      I have personally reviewed labs listed below:    Latest Ref Rng & Units 07/21/2024   10:53 AM  CMP  Glucose 70 - 99 mg/dL 844   BUN 8 - 23 mg/dL 24   Creatinine 9.38 - 1.24 mg/dL 8.36   Sodium 864 - 854 mmol/L 137   Potassium 3.5 - 5.1 mmol/L 3.9   Chloride 98 - 111 mmol/L 108   CO2 22 - 32 mmol/L 21   Calcium 8.9 - 10.3 mg/dL 9.1   Total Protein 6.5 - 8.1 g/dL 6.7   Total Bilirubin 0.0 - 1.2 mg/dL 1.0   Alkaline Phos 38 - 126 U/L 62   AST 15 - 41 U/L 25   ALT 0 - 44 U/L 24       Latest Ref Rng & Units 07/21/2024   10:53 AM  CBC  WBC 4.0 - 10.5 K/uL 8.2   Hemoglobin 13.0 - 17.0 g/dL 86.5   Hematocrit 60.9 - 52.0 % 39.3   Platelets 150 - 400 K/uL 171       Assessment and plan- Patient is a 78 y.o. male here for routine follow-up of IgA MGUS  IgA monoclonal gammopathy of undetermined significance (MGUS) IgA MGUS stable with no progression to multiple myeloma. Normal CBC, stable hemoglobin at 13.4. M protein levels low and stable. Light chains stable over five years. - Continue annual monitoring with blood work.  Chronic kidney disease, unspecified stage Chronic kidney disease stable with creatinine at 1.63, unchanged. - Continue monitoring kidney function.  Hypotension Blood pressure 106-110 systolic, acceptable. Heart rate in fifties, not concerning if systolic remains above 90. Amlodipine discontinued by nephrologist, no significant changes noted. - Discuss with primary care physician regarding potential adjustments to blood pressure regimen.   Visit Diagnosis 1. MGUS (monoclonal gammopathy of unknown significance)      Dr. Annah Skene, MD, MPH [facility] [phone] 08/04/2024 12:34 PM

## 2025-04-06 ENCOUNTER — Ambulatory Visit: Admitting: Dermatology

## 2025-08-04 ENCOUNTER — Inpatient Hospital Stay

## 2025-08-18 ENCOUNTER — Inpatient Hospital Stay: Admitting: Oncology
